# Patient Record
Sex: Female | Born: 1970 | Race: White | Hispanic: No | Marital: Married | Smoking: Never smoker
Health system: Southern US, Community
[De-identification: ages and names within clinical notes are randomized; demographics above are authoritative.]

## PROBLEM LIST (undated history)

## (undated) DIAGNOSIS — E119 Type 2 diabetes mellitus without complications: Secondary | ICD-10-CM

## (undated) DIAGNOSIS — R112 Nausea with vomiting, unspecified: Secondary | ICD-10-CM

## (undated) DIAGNOSIS — R011 Cardiac murmur, unspecified: Secondary | ICD-10-CM

## (undated) DIAGNOSIS — Z9889 Other specified postprocedural states: Secondary | ICD-10-CM

## (undated) DIAGNOSIS — Z87442 Personal history of urinary calculi: Secondary | ICD-10-CM

## (undated) DIAGNOSIS — Z8489 Family history of other specified conditions: Secondary | ICD-10-CM

## (undated) DIAGNOSIS — I10 Essential (primary) hypertension: Secondary | ICD-10-CM

## (undated) HISTORY — PX: HERNIA REPAIR: SHX51

## (undated) HISTORY — PX: CHOLECYSTECTOMY: SHX55

## (undated) HISTORY — PX: SHOULDER SURGERY: SHX246

## (undated) HISTORY — PX: TUBAL LIGATION: SHX77

## (undated) HISTORY — PX: TEMPOROMANDIBULAR JOINT SURGERY: SHX35

## (undated) HISTORY — PX: HIP SURGERY: SHX245

## (undated) HISTORY — PX: ABDOMINAL HYSTERECTOMY: SHX81

## (undated) HISTORY — PX: APPENDECTOMY: SHX54

---

## 2002-04-02 ENCOUNTER — Other Ambulatory Visit: Admission: RE | Admit: 2002-04-02 | Discharge: 2002-04-02 | Payer: Self-pay | Admitting: Obstetrics & Gynecology

## 2002-09-16 ENCOUNTER — Inpatient Hospital Stay (HOSPITAL_COMMUNITY): Admission: RE | Admit: 2002-09-16 | Discharge: 2002-09-17 | Payer: Self-pay | Admitting: Obstetrics & Gynecology

## 2003-05-12 ENCOUNTER — Emergency Department (HOSPITAL_COMMUNITY): Admission: EM | Admit: 2003-05-12 | Discharge: 2003-05-12 | Payer: Self-pay | Admitting: Emergency Medicine

## 2005-05-21 ENCOUNTER — Other Ambulatory Visit: Admission: RE | Admit: 2005-05-21 | Discharge: 2005-05-21 | Payer: Self-pay | Admitting: Obstetrics & Gynecology

## 2014-11-29 ENCOUNTER — Other Ambulatory Visit (HOSPITAL_COMMUNITY): Payer: Self-pay | Admitting: Obstetrics & Gynecology

## 2014-11-30 LAB — CYTOLOGY - PAP

## 2018-01-09 ENCOUNTER — Other Ambulatory Visit: Payer: Self-pay | Admitting: Surgery

## 2018-01-09 DIAGNOSIS — R599 Enlarged lymph nodes, unspecified: Secondary | ICD-10-CM

## 2018-01-28 ENCOUNTER — Ambulatory Visit
Admission: RE | Admit: 2018-01-28 | Discharge: 2018-01-28 | Disposition: A | Discharge status: Home / Self Care | Payer: BLUE CROSS/BLUE SHIELD | Source: Ambulatory Visit | Attending: Surgery | Admitting: Surgery

## 2018-01-28 DIAGNOSIS — R599 Enlarged lymph nodes, unspecified: Secondary | ICD-10-CM

## 2018-01-28 MED ORDER — IOPAMIDOL (ISOVUE-300) INJECTION 61%
100.0000 mL | Freq: Once | INTRAVENOUS | Status: AC | PRN
  Administered 2018-01-28: 100 mL via INTRAVENOUS

## 2018-02-03 ENCOUNTER — Other Ambulatory Visit: Payer: Self-pay | Admitting: Surgery

## 2018-02-06 ENCOUNTER — Other Ambulatory Visit: Payer: Self-pay | Admitting: Surgery

## 2018-02-06 DIAGNOSIS — R599 Enlarged lymph nodes, unspecified: Secondary | ICD-10-CM

## 2018-02-13 ENCOUNTER — Ambulatory Visit
Admission: RE | Admit: 2018-02-13 | Discharge: 2018-02-13 | Disposition: A | Discharge status: Home / Self Care | Payer: BLUE CROSS/BLUE SHIELD | Source: Ambulatory Visit | Attending: Surgery | Admitting: Surgery

## 2018-02-13 DIAGNOSIS — R599 Enlarged lymph nodes, unspecified: Secondary | ICD-10-CM

## 2018-02-26 ENCOUNTER — Ambulatory Visit: Payer: Self-pay | Admitting: Surgery

## 2018-02-26 NOTE — Patient Instructions (Addendum)
Jill Savage  02/26/2018   Your procedure is scheduled on: 03-03-18   Report to [facility] Main  Entrance             Report to admitting at        0730 AM    Call this number if you have problems the morning of surgery [phone]    Remember: Do not eat food or drink liquids :After Midnight.     Take these medicines the morning of surgery with A SIP OF WATER: omeprazole, lipitor, zyrtec                                 You may not have any metal on your body including hair pins and              piercings  Do not wear jewelry, make-up, lotions, powders or perfumes, deodorant             Do not wear nail polish.  Do not shave  48 hours prior to surgery.            Do not bring valuables to the hospital. [place].  Contacts, dentures or bridgework may not be worn into surgery.     Patients discharged the day of surgery will not be allowed to drive home.  Name and phone number of your driver:  Special Instructions: N/A              Please read over the following fact sheets you were given: _____________________________________________________________________           [facility] - Preparing for Surgery Before surgery, you can play an important role.  Because skin is not sterile, your skin needs to be as free of germs as possible.  You can reduce the number of germs on your skin by washing with CHG (chlorahexidine gluconate) soap before surgery.  CHG is an antiseptic cleaner which kills germs and bonds with the skin to continue killing germs even after washing. Please DO NOT use if you have an allergy to CHG or antibacterial soaps.  If your skin becomes reddened/irritated stop using the CHG and inform your nurse when you arrive at Short Stay. Do not shave (including legs and underarms) for at least 48 hours prior to the first CHG shower.  You may shave your face/neck. Please follow these instructions  carefully:  1.  Shower with CHG Soap the night before surgery and the  morning of Surgery.  2.  If you choose to wash your hair, wash your hair first as usual with your  normal  shampoo.  3.  After you shampoo, rinse your hair and body thoroughly to remove the  shampoo.                           4.  Use CHG as you would any other liquid soap.  You can apply chg directly  to the skin and wash                       Gently with a scrungie or clean washcloth.  5.  Apply the CHG Soap to your body ONLY  FROM THE NECK DOWN.   Do not use on face/ open                           Wound or open sores. Avoid contact with eyes, ears mouth and genitals (private parts).                       Wash face,  Genitals (private parts) with your normal soap.             6.  Wash thoroughly, paying special attention to the area where your surgery  will be performed.  7.  Thoroughly rinse your body with warm water from the neck down.  8.  DO NOT shower/wash with your normal soap after using and rinsing off  the CHG Soap.                9.  Pat yourself dry with a clean towel.            10.  Wear clean pajamas.            11.  Place clean sheets on your bed the night of your first shower and do not  sleep with pets. Day of Surgery : Do not apply any lotions/deodorants the morning of surgery.  Please wear clean clothes to the hospital/surgery center.  FAILURE TO FOLLOW THESE INSTRUCTIONS MAY RESULT IN THE CANCELLATION OF YOUR SURGERY PATIENT SIGNATURE_________________________________  NURSE SIGNATURE__________________________________  ________________________________________________________________________ How to Manage Your Diabetes Before and After Surgery  Why is it important to control my blood sugar before and after surgery? . Improving blood sugar levels before and after surgery helps healing and can limit problems. . A way of improving blood sugar control is eating a healthy diet by: o  Eating less sugar and  carbohydrates o  Increasing activity/exercise o  Talking with your doctor about reaching your blood sugar goals . High blood sugars (greater than 180 mg/dL) can raise your risk of infections and slow your recovery, so you will need to focus on controlling your diabetes during the weeks before surgery. . Make sure that the doctor who takes care of your diabetes knows about your planned surgery including the date and location.  How do I manage my blood sugar before surgery? . Check your blood sugar at least 4 times a day, starting 2 days before surgery, to make sure that the level is not too high or low. o Check your blood sugar the morning of your surgery when you wake up and every 2 hours until you get to the Short Stay unit. . If your blood sugar is less than 70 mg/dL, you will need to treat for low blood sugar: o Do not take insulin. o Treat a low blood sugar (less than 70 mg/dL) with  cup of clear juice (cranberry or apple), 4 glucose tablets, OR glucose gel. o Recheck blood sugar in 15 minutes after treatment (to make sure it is greater than 70 mg/dL). If your blood sugar is not greater than 70 mg/dL on recheck, call [phone] for further instructions. . Report your blood sugar to the short stay nurse when you get to Short Stay.  . If you are admitted to the hospital after surgery: o Your blood sugar will be checked by the staff and you will probably be given insulin after surgery (instead of oral diabetes medicines) to make sure you have good blood sugar levels.  o The goal for blood sugar control after surgery is 80-180 mg/dL.   WHAT DO I DO ABOUT MY DIABETES MEDICATION?  Marland Kitchen  . Do not take oral diabetes medicines (pills) the morning of surgery.  Marland Kitchen ncluding Byetta (exenatide), Bydureon (exenatide ER), Victoza (liraglutide), or Trulicity (dulaglutide).  . If your CBG is greater than 220 mg/dL, you may take  of your sliding scale  . (correction) dose of insulin.    For patients  with insulin pumps: Contact your diabetes doctor for specific instructions before surgery. Decrease basal rates by 20% at midnight the night before your surgery. Note that if your surgery is planned to be longer than 2 hours, your insulin pump will be removed and intravenous (IV) insulin will be started and managed by the nurses and the anesthesiologist. You will be able to restart your insulin pump once you are awake and able to manage it.  Make sure to bring insulin pump supplies to the hospital with you in case the  site needs to be changed.  Patient Signature:  Date:   Nurse Signature:  Date:   Reviewed and Endorsed by [facility] Patient Education Committee, August 2015

## 2018-02-27 ENCOUNTER — Other Ambulatory Visit: Payer: Self-pay

## 2018-02-27 ENCOUNTER — Encounter (HOSPITAL_COMMUNITY): Payer: Self-pay

## 2018-02-27 ENCOUNTER — Encounter (HOSPITAL_COMMUNITY)
Admission: RE | Admit: 2018-02-27 | Discharge: 2018-02-27 | Disposition: A | Discharge status: Home / Self Care | Payer: BLUE CROSS/BLUE SHIELD | Source: Ambulatory Visit | Attending: Surgery | Admitting: Surgery

## 2018-02-27 DIAGNOSIS — Z01810 Encounter for preprocedural cardiovascular examination: Secondary | ICD-10-CM | POA: Insufficient documentation

## 2018-02-27 DIAGNOSIS — Z01812 Encounter for preprocedural laboratory examination: Secondary | ICD-10-CM | POA: Insufficient documentation

## 2018-02-27 DIAGNOSIS — K4090 Unilateral inguinal hernia, without obstruction or gangrene, not specified as recurrent: Secondary | ICD-10-CM | POA: Insufficient documentation

## 2018-02-27 HISTORY — DX: Other specified postprocedural states: Z98.890

## 2018-02-27 HISTORY — DX: Personal history of urinary calculi: Z87.442

## 2018-02-27 HISTORY — DX: Family history of other specified conditions: Z84.89

## 2018-02-27 HISTORY — DX: Cardiac murmur, unspecified: R01.1

## 2018-02-27 HISTORY — DX: Nausea with vomiting, unspecified: R11.2

## 2018-02-27 HISTORY — DX: Essential (primary) hypertension: I10

## 2018-02-27 HISTORY — DX: Type 2 diabetes mellitus without complications: E11.9

## 2018-02-27 LAB — BASIC METABOLIC PANEL
Anion gap: 8 (ref 5–15)
BUN: 14 mg/dL (ref 6–20)
CO2: 31 mmol/L (ref 22–32)
CREATININE: 0.68 mg/dL (ref 0.44–1.00)
Calcium: 9.4 mg/dL (ref 8.9–10.3)
Chloride: 101 mmol/L (ref 98–111)
Glucose, Bld: 258 mg/dL — ABNORMAL HIGH (ref 70–99)
Potassium: 4.2 mmol/L (ref 3.5–5.1)
SODIUM: 140 mmol/L (ref 135–145)

## 2018-02-27 LAB — CBC
HCT: 41.1 % (ref 36.0–46.0)
HEMOGLOBIN: 14.1 g/dL (ref 12.0–15.0)
MCH: 30.1 pg (ref 26.0–34.0)
MCHC: 34.3 g/dL (ref 30.0–36.0)
MCV: 87.6 fL (ref 78.0–100.0)
Platelets: 294 10*3/uL (ref 150–400)
RBC: 4.69 MIL/uL (ref 3.87–5.11)
RDW: 12.6 % (ref 11.5–15.5)
WBC: 5.3 10*3/uL (ref 4.0–10.5)

## 2018-02-27 LAB — GLUCOSE, CAPILLARY: Glucose-Capillary: 281 mg/dL — ABNORMAL HIGH (ref 70–99)

## 2018-02-27 LAB — HEMOGLOBIN A1C
HEMOGLOBIN A1C: 7.3 % — AB (ref 4.8–5.6)
MEAN PLASMA GLUCOSE: 162.81 mg/dL

## 2018-02-28 ENCOUNTER — Other Ambulatory Visit (HOSPITAL_COMMUNITY): Payer: BLUE CROSS/BLUE SHIELD

## 2018-03-03 ENCOUNTER — Ambulatory Visit (HOSPITAL_COMMUNITY): Payer: BLUE CROSS/BLUE SHIELD | Admitting: Anesthesiology

## 2018-03-03 ENCOUNTER — Ambulatory Visit (HOSPITAL_COMMUNITY)
Admission: RE | Admit: 2018-03-03 | Discharge: 2018-03-03 | Disposition: A | Discharge status: Home / Self Care | Payer: BLUE CROSS/BLUE SHIELD | Source: Ambulatory Visit | Attending: Surgery | Admitting: Surgery

## 2018-03-03 ENCOUNTER — Encounter (HOSPITAL_COMMUNITY): Payer: Self-pay | Admitting: Surgery

## 2018-03-03 ENCOUNTER — Encounter (HOSPITAL_COMMUNITY)
Admission: RE | Disposition: A | Discharge status: Home / Self Care | Payer: Self-pay | Source: Ambulatory Visit | Attending: Surgery

## 2018-03-03 DIAGNOSIS — D219 Benign neoplasm of connective and other soft tissue, unspecified: Secondary | ICD-10-CM | POA: Insufficient documentation

## 2018-03-03 DIAGNOSIS — Z820 Family history of epilepsy and other diseases of the nervous system: Secondary | ICD-10-CM | POA: Insufficient documentation

## 2018-03-03 DIAGNOSIS — E108 Type 1 diabetes mellitus with unspecified complications: Secondary | ICD-10-CM | POA: Insufficient documentation

## 2018-03-03 DIAGNOSIS — K4030 Unilateral inguinal hernia, with obstruction, without gangrene, not specified as recurrent: Secondary | ICD-10-CM | POA: Diagnosis not present

## 2018-03-03 DIAGNOSIS — Z882 Allergy status to sulfonamides status: Secondary | ICD-10-CM | POA: Diagnosis not present

## 2018-03-03 DIAGNOSIS — Z79899 Other long term (current) drug therapy: Secondary | ICD-10-CM | POA: Diagnosis not present

## 2018-03-03 DIAGNOSIS — Z888 Allergy status to other drugs, medicaments and biological substances status: Secondary | ICD-10-CM | POA: Insufficient documentation

## 2018-03-03 DIAGNOSIS — Z87442 Personal history of urinary calculi: Secondary | ICD-10-CM | POA: Diagnosis not present

## 2018-03-03 DIAGNOSIS — Z8249 Family history of ischemic heart disease and other diseases of the circulatory system: Secondary | ICD-10-CM | POA: Insufficient documentation

## 2018-03-03 DIAGNOSIS — Z8261 Family history of arthritis: Secondary | ICD-10-CM | POA: Insufficient documentation

## 2018-03-03 DIAGNOSIS — K4090 Unilateral inguinal hernia, without obstruction or gangrene, not specified as recurrent: Secondary | ICD-10-CM

## 2018-03-03 DIAGNOSIS — Z90710 Acquired absence of both cervix and uterus: Secondary | ICD-10-CM | POA: Insufficient documentation

## 2018-03-03 DIAGNOSIS — I10 Essential (primary) hypertension: Secondary | ICD-10-CM | POA: Diagnosis not present

## 2018-03-03 DIAGNOSIS — K219 Gastro-esophageal reflux disease without esophagitis: Secondary | ICD-10-CM | POA: Insufficient documentation

## 2018-03-03 DIAGNOSIS — Z841 Family history of disorders of kidney and ureter: Secondary | ICD-10-CM | POA: Diagnosis not present

## 2018-03-03 DIAGNOSIS — Z9049 Acquired absence of other specified parts of digestive tract: Secondary | ICD-10-CM | POA: Insufficient documentation

## 2018-03-03 DIAGNOSIS — Z794 Long term (current) use of insulin: Secondary | ICD-10-CM | POA: Diagnosis not present

## 2018-03-03 DIAGNOSIS — R1909 Other intra-abdominal and pelvic swelling, mass and lump: Secondary | ICD-10-CM | POA: Diagnosis present

## 2018-03-03 DIAGNOSIS — Z833 Family history of diabetes mellitus: Secondary | ICD-10-CM | POA: Insufficient documentation

## 2018-03-03 HISTORY — PX: INSERTION OF MESH: SHX5868

## 2018-03-03 HISTORY — PX: INGUINAL HERNIA REPAIR: SHX194

## 2018-03-03 LAB — GLUCOSE, CAPILLARY
Glucose-Capillary: 185 mg/dL — ABNORMAL HIGH (ref 70–99)
Glucose-Capillary: 169 mg/dL — ABNORMAL HIGH (ref 70–99)

## 2018-03-03 SURGERY — REPAIR, HERNIA, INGUINAL, ADULT
Anesthesia: General | Laterality: Right

## 2018-03-03 MED ORDER — FENTANYL CITRATE (PF) 100 MCG/2ML IJ SOLN
INTRAMUSCULAR | Status: AC
  Filled 2018-03-03: qty 2

## 2018-03-03 MED ORDER — PROPOFOL 500 MG/50ML IV EMUL
INTRAVENOUS | Status: DC | PRN
  Administered 2018-03-03: 50 ug/kg/min via INTRAVENOUS

## 2018-03-03 MED ORDER — MIDAZOLAM HCL 2 MG/2ML IJ SOLN
INTRAMUSCULAR | Status: AC
  Filled 2018-03-03: qty 2

## 2018-03-03 MED ORDER — LIDOCAINE 2% (20 MG/ML) 5 ML SYRINGE
INTRAMUSCULAR | Status: DC | PRN
  Administered 2018-03-03: 50 mg via INTRAVENOUS

## 2018-03-03 MED ORDER — MIDAZOLAM HCL 5 MG/5ML IJ SOLN
INTRAMUSCULAR | Status: DC | PRN
  Administered 2018-03-03: 2 mg via INTRAVENOUS

## 2018-03-03 MED ORDER — ONDANSETRON HCL 4 MG/2ML IJ SOLN: 4.0000 mg | Freq: Once | INTRAMUSCULAR | Status: DC | PRN

## 2018-03-03 MED ORDER — CHLORHEXIDINE GLUCONATE CLOTH 2 % EX PADS: 6.0000 | MEDICATED_PAD | Freq: Once | CUTANEOUS | Status: DC

## 2018-03-03 MED ORDER — PROPOFOL 10 MG/ML IV BOLUS
INTRAVENOUS | Status: AC
  Filled 2018-03-03: qty 20

## 2018-03-03 MED ORDER — FENTANYL CITRATE (PF) 100 MCG/2ML IJ SOLN
INTRAMUSCULAR | Status: DC | PRN
  Administered 2018-03-03 (×2): 50 ug via INTRAVENOUS

## 2018-03-03 MED ORDER — OXYCODONE HCL 5 MG PO TABS: 5.0000 mg | ORAL_TABLET | Freq: Once | ORAL | Status: DC | PRN

## 2018-03-03 MED ORDER — OXYCODONE HCL 5 MG/5ML PO SOLN
5.0000 mg | Freq: Once | ORAL | Status: DC | PRN
  Filled 2018-03-03: qty 5

## 2018-03-03 MED ORDER — REMIFENTANIL HCL 1 MG IV SOLR
INTRAVENOUS | Status: DC | PRN
  Administered 2018-03-03: .25 ug/kg/min via INTRAVENOUS

## 2018-03-03 MED ORDER — MIDAZOLAM HCL 2 MG/2ML IJ SOLN: 1.0000 mg | Freq: Once | INTRAMUSCULAR | Status: DC

## 2018-03-03 MED ORDER — PROPOFOL 10 MG/ML IV BOLUS
INTRAVENOUS | Status: AC
Start: 2018-03-03 — End: ?
  Filled 2018-03-03: qty 40

## 2018-03-03 MED ORDER — BUPIVACAINE HCL (PF) 0.5 % IJ SOLN
INTRAMUSCULAR | Status: DC | PRN
  Administered 2018-03-03: 20 mL

## 2018-03-03 MED ORDER — FENTANYL CITRATE (PF) 100 MCG/2ML IJ SOLN
50.0000 ug | Freq: Once | INTRAMUSCULAR | Status: DC
Start: 2018-03-03 — End: 2018-03-03

## 2018-03-03 MED ORDER — ROCURONIUM BROMIDE 10 MG/ML (PF) SYRINGE
PREFILLED_SYRINGE | INTRAVENOUS | Status: DC | PRN
  Administered 2018-03-03: 40 mg via INTRAVENOUS

## 2018-03-03 MED ORDER — SUGAMMADEX SODIUM 200 MG/2ML IV SOLN
INTRAVENOUS | Status: DC | PRN
  Administered 2018-03-03: 140 mg via INTRAVENOUS

## 2018-03-03 MED ORDER — PROPOFOL 10 MG/ML IV BOLUS
INTRAVENOUS | Status: DC | PRN
  Administered 2018-03-03: 150 mg via INTRAVENOUS

## 2018-03-03 MED ORDER — 0.9 % SODIUM CHLORIDE (POUR BTL) OPTIME
TOPICAL | Status: DC | PRN
  Administered 2018-03-03: 1000 mL

## 2018-03-03 MED ORDER — FENTANYL CITRATE (PF) 100 MCG/2ML IJ SOLN
25.0000 ug | INTRAMUSCULAR | Status: DC | PRN
  Administered 2018-03-03: 50 ug via INTRAVENOUS

## 2018-03-03 MED ORDER — BUPIVACAINE HCL (PF) 0.5 % IJ SOLN
INTRAMUSCULAR | Status: AC
  Filled 2018-03-03: qty 30

## 2018-03-03 MED ORDER — REMIFENTANIL HCL 1 MG IV SOLR
INTRAVENOUS | Status: AC
  Filled 2018-03-03: qty 1000

## 2018-03-03 MED ORDER — FENTANYL CITRATE (PF) 250 MCG/5ML IJ SOLN
INTRAMUSCULAR | Status: AC
  Filled 2018-03-03: qty 5

## 2018-03-03 MED ORDER — SODIUM CHLORIDE 0.9 % IJ SOLN
INTRAMUSCULAR | Status: AC
  Filled 2018-03-03: qty 20

## 2018-03-03 MED ORDER — ONDANSETRON HCL 4 MG/2ML IJ SOLN
INTRAMUSCULAR | Status: DC | PRN
  Administered 2018-03-03: 4 mg via INTRAVENOUS

## 2018-03-03 MED ORDER — DEXAMETHASONE SODIUM PHOSPHATE 10 MG/ML IJ SOLN
INTRAMUSCULAR | Status: DC | PRN
  Administered 2018-03-03: 8 mg via INTRAVENOUS

## 2018-03-03 MED ORDER — TRAMADOL HCL 50 MG PO TABS: 50.0000 mg | ORAL_TABLET | Freq: Four times a day (QID) | ORAL | 0 refills | Status: DC | PRN

## 2018-03-03 MED ORDER — CEFAZOLIN SODIUM-DEXTROSE 2-4 GM/100ML-% IV SOLN
2.0000 g | INTRAVENOUS | Status: AC
  Administered 2018-03-03: 2 g via INTRAVENOUS
  Filled 2018-03-03: qty 100

## 2018-03-03 MED ORDER — DEXAMETHASONE SODIUM PHOSPHATE 10 MG/ML IJ SOLN
INTRAMUSCULAR | Status: AC
  Filled 2018-03-03: qty 1

## 2018-03-03 MED ORDER — LACTATED RINGERS IV SOLN
INTRAVENOUS | Status: DC
Start: 2018-03-03 — End: 2018-03-03
  Administered 2018-03-03 (×2): via INTRAVENOUS

## 2018-03-03 MED ORDER — ONDANSETRON HCL 4 MG/2ML IJ SOLN
INTRAMUSCULAR | Status: AC
  Filled 2018-03-03: qty 2

## 2018-03-03 SURGICAL SUPPLY — 35 items
APL SWBSTK 6 STRL LF DISP (MISCELLANEOUS) ×1
APPLICATOR COTTON TIP 6 STRL (MISCELLANEOUS) ×1 IMPLANT
APPLICATOR COTTON TIP 6IN STRL (MISCELLANEOUS) ×2
BLADE SURG 15 STRL LF DISP TIS (BLADE) ×1 IMPLANT
BLADE SURG 15 STRL SS (BLADE) ×2
BLADE SURG SZ10 CARB STEEL (BLADE) IMPLANT
CHLORAPREP W/TINT 26ML (MISCELLANEOUS) ×3 IMPLANT
COVER SURGICAL LIGHT HANDLE (MISCELLANEOUS) ×2 IMPLANT
DECANTER SPIKE VIAL GLASS SM (MISCELLANEOUS) ×2 IMPLANT
DRAIN PENROSE 18X1/2 LTX STRL (DRAIN) ×1 IMPLANT
DRAPE LAPAROTOMY TRNSV 102X78 (DRAPE) ×2 IMPLANT
ELECT PENCIL ROCKER SW 15FT (MISCELLANEOUS) ×2 IMPLANT
ELECT REM PT RETURN 15FT ADLT (MISCELLANEOUS) ×2 IMPLANT
GAUZE SPONGE 4X4 12PLY STRL (GAUZE/BANDAGES/DRESSINGS) ×2 IMPLANT
GLOVE BIOGEL PI IND STRL 7.0 (GLOVE) ×1 IMPLANT
GLOVE BIOGEL PI INDICATOR 7.0 (GLOVE) ×1
GLOVE SURG ORTHO 8.0 STRL STRW (GLOVE) ×2 IMPLANT
GOWN STRL REUS W/TWL LRG LVL3 (GOWN DISPOSABLE) ×2 IMPLANT
GOWN STRL REUS W/TWL XL LVL3 (GOWN DISPOSABLE) ×4 IMPLANT
KIT BASIN OR (CUSTOM PROCEDURE TRAY) ×2 IMPLANT
MESH ULTRAPRO 3X6 7.6X15CM (Mesh General) ×1 IMPLANT
NDL HYPO 25X1 1.5 SAFETY (NEEDLE) ×1 IMPLANT
NEEDLE HYPO 25X1 1.5 SAFETY (NEEDLE) ×2 IMPLANT
PACK BASIC VI WITH GOWN DISP (CUSTOM PROCEDURE TRAY) ×2 IMPLANT
SPONGE LAP 4X18 RFD (DISPOSABLE) ×4 IMPLANT
STRIP CLOSURE SKIN 1/2X4 (GAUZE/BANDAGES/DRESSINGS) ×2 IMPLANT
SUT MNCRL AB 4-0 PS2 18 (SUTURE) ×2 IMPLANT
SUT NOVA NAB GS-21 0 18 T12 DT (SUTURE) ×1 IMPLANT
SUT NOVA NAB GS-22 2 0 T19 (SUTURE) ×4 IMPLANT
SUT SILK 2 0 SH (SUTURE) ×2 IMPLANT
SUT VIC AB 3-0 SH 18 (SUTURE) ×3 IMPLANT
SYR BULB IRRIGATION 50ML (SYRINGE) ×2 IMPLANT
SYR CONTROL 10ML LL (SYRINGE) ×2 IMPLANT
TOWEL OR 17X26 10 PK STRL BLUE (TOWEL DISPOSABLE) ×2 IMPLANT
YANKAUER SUCT BULB TIP 10FT TU (MISCELLANEOUS) ×2 IMPLANT

## 2018-03-03 NOTE — H&P (Signed)
General Surgery [facility]   Jill Savage DOB: [birth year] Married / Language: English / Race: White Female   History of Present Illness   The patient is a 47 year old female who presents with a complaint of enlarge lymph nodes.  CHIEF COMPLAINT: Enlarged right inguinal lymph node  Patient is referred by Dr. Dory Horn for surgical evaluation and management of an apparent enlarged right inguinal lymph node. Patient has a history of orthopedic problems with her hips. She has undergone bilateral hip surgery. Her last surgery on the left hip was 4 years ago. Her last procedure on the right hip was 1.5 years ago. Follow-up included an MRI scan done Nov 26, 2017 at [facility]. This had an incidental finding of a 3.7 cm right inguinal mass felt to represent either an enlarged lymph node, possible lymphocele, or other mass. This was communicated to her gynecologist. She underwent an ultrasound in the office. She also had laboratory studies which are reportedly normal. Patient is now referred to general surgery for further evaluation and recommendations. Patient denies any symptoms. She is certainly had no symptoms like weight loss or night sweats. She has had no history of recent trauma to the lower extremities. She denies any bug bites. She does have a dog but does not have a cat. Patient has had no evidence of other lymphadenopathy on self-examination. She presents today for assessment and recommendations.   Past Surgical History Appendectomy  Cesarean Section - 1  Gallbladder Surgery - Laparoscopic  Hip Surgery  Bilateral. Hysterectomy (not due to cancer) - Partial  Oral Surgery  Shoulder Surgery  Bilateral.  Diagnostic Studies History Colonoscopy  never Mammogram  within last year Pap Smear  1-5 years ago  Allergies Sulfa Antibiotics  Allergies Reconciled   Medication History NovoLOG (100UNIT/ML Solution, Subcutaneous)  Active. Hyzaar (100-12.5MG  Tablet, Oral) Active. Lipitor (10MG  Tablet, Oral) Active. Omeprazole (20MG  Capsule DR, Oral) Active. Potassium (99MG  Tablet, Oral) Active. Vitamin D (1000UNIT Tablet, Oral) Active. ZyrTEC Allergy (10MG  Tablet, Oral) Active. Medications Reconciled  Social History Alcohol use  Occasional alcohol use. Caffeine use  Coffee. No drug use  Tobacco use  Never smoker.  Family History Arthritis  Mother. Diabetes Mellitus  Father. Heart Disease  Father. Hypertension  Brother, Father, Mother. Ischemic Bowel Disease  Brother, Mother. Kidney Disease  Father. Migraine Headache  Mother.  Pregnancy / Birth History Age at menarche  57 years. Contraceptive History  Oral contraceptives. Gravida  1 Length (months) of breastfeeding  12-24 Maternal age  57-30 Para  1  Other Problems  Diabetes Mellitus  Gastroesophageal Reflux Disease  General anesthesia - complications  High blood pressure  Hypercholesterolemia  Kidney Stone   Review of Systems General Not Present- Appetite Loss, Chills, Fatigue, Fever, Night Sweats, Weight Gain and Weight Loss. Skin Not Present- Change in Wart/Mole, Dryness, Hives, Jaundice, New Lesions, Non-Healing Wounds, Rash and Ulcer. HEENT Present- Seasonal Allergies and Wears glasses/contact lenses. Not Present- Earache, Hearing Loss, Hoarseness, Nose Bleed, Oral Ulcers, Ringing in the Ears, Sinus Pain, Sore Throat, Visual Disturbances and Yellow Eyes. Respiratory Not Present- Bloody sputum, Chronic Cough, Difficulty Breathing, Snoring and Wheezing. Breast Not Present- Breast Mass, Breast Pain, Nipple Discharge and Skin Changes. Cardiovascular Not Present- Chest Pain, Difficulty Breathing Lying Down, Leg Cramps, Palpitations, Rapid Heart Rate, Shortness of Breath and Swelling of Extremities. Gastrointestinal Not Present- Abdominal Pain, Bloating, Bloody Stool, Change in Bowel Habits, Chronic diarrhea,  Constipation, Difficulty Swallowing, Excessive gas, Gets full quickly at  meals, Hemorrhoids, Indigestion, Nausea, Rectal Pain and Vomiting. Female Genitourinary Present- Pelvic Pain. Not Present- Frequency, Nocturia, Painful Urination and Urgency. Musculoskeletal Present- Joint Stiffness. Not Present- Back Pain, Joint Pain, Muscle Pain, Muscle Weakness and Swelling of Extremities. Neurological Not Present- Decreased Memory, Fainting, Headaches, Numbness, Seizures, Tingling, Tremor, Trouble walking and Weakness. Psychiatric Not Present- Anxiety, Bipolar, Change in Sleep Pattern, Depression, Fearful and Frequent crying. Endocrine Not Present- Cold Intolerance, Excessive Hunger, Hair Changes, Heat Intolerance, Hot flashes and New Diabetes. Hematology Not Present- Blood Thinners, Easy Bruising, Excessive bleeding, Gland problems, HIV and Persistent Infections.  Vitals Weight: 153.6 lb Height: 63in Body Surface Area: 1.73 m Body Mass Index: 27.21 kg/m  Temp.: 97.44F  Pulse: 100 (Regular)  BP: 132/88 (Sitting, Left Arm, Standard)  Physical Exam The physical exam findings are as follows: Note:See vital signs recorded above  GENERAL APPEARANCE Development: normal Nutritional status: normal Gross deformities: none  SKIN Rash, lesions, ulcers: none Induration, erythema: none Nodules: none palpable  EYES Conjunctiva and lids: normal Pupils: equal and reactive Iris: normal bilaterally  EARS, NOSE, MOUTH, THROAT External ears: no lesion or deformity External nose: no lesion or deformity Hearing: grossly normal Lips: no lesion or deformity Dentition: normal for age Oral mucosa: moist  NECK Symmetric: yes Trachea: midline Thyroid: no palpable nodules in the thyroid bed  CHEST Respiratory effort: normal Retraction or accessory muscle use: no Breath sounds: normal bilaterally Rales, rhonchi, wheeze: none  CARDIOVASCULAR Auscultation: regular rhythm, normal  rate Murmurs: none Pulses: carotid and radial pulse 2+ palpable Lower extremity edema: none Lower extremity varicosities: none  ABDOMEN Distension: none Masses: none palpable Tenderness: none Hepatosplenomegaly: not present Hernia: not present  GENITOURINARY Palpation in both the left and right inguinal region reveals no palpable mass and no tenderness. Well-healed Pfannenstiel incision  MUSCULOSKELETAL Station and gait: normal Digits and nails: no clubbing or cyanosis Muscle strength: grossly normal all extremities Range of motion: grossly normal all extremities Deformity: none  LYMPHATIC Cervical: none palpable Supraclavicular: none palpable No palpable inguinal lymphadenopathy  PSYCHIATRIC Oriented to person, place, and time: yes Mood and affect: normal for situation Judgment and insight: appropriate for situation    Assessment & Plan  ENLARGED LYMPH NODE (R59.9)  Follow Up - Call [facility] office after tests / studies doneto discuss further plans  Patient presents on referral from her gynecologist for evaluation of an enlarged right inguinal lymph node seen on MRI scan on Nov 26, 2017. Lymph node measured approximately 3.7 cm in greatest dimension. Alternatively, this may represent a lymphocele or other soft tissue mass. Patient is asymptomatic. She has no worrisome symptoms or findings.  We discussed further evaluation. Radiology has recommended a CT scan with contrast. We will make arrangements for the study in the near future. I will contact her with those results when they are available. If necessary, we will consider a percutaneous needle biopsy for tissue diagnosis pending the CT findings. Likely this is a benign process.  Patient will undergo CT scan of we will contact her with those results when they are available.   ADDENDUM:  History of Present Illness  The patient is a 47 year old female who presents for a pre-op visit.  CHIEF COMPLAINT: right  groin mass, likely ovary in [facility]  Patient returns to discuss results of CT scan, abdominal ultrasound, and transvaginal ultrasound. It appears that there is most likely an ovary in the right inguinal canal. This represents a right inguinal hernia with incarcerated ovary. This does not appear to  be a lymph node. Ovary appears viable. Patient has been having more symptoms with lifting and physical activity. She would like to proceed with surgery in the near future.   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 Hysterectomy (not due to cancer) - Partial  Oral Surgery  Shoulder Surgery  Bilateral.  Diagnostic Studies History Colonoscopy  never Mammogram  within last year Pap Smear  1-5 years ago  Allergies Sulfa Antibiotics   Medication History NovoLOG (100UNIT/ML Solution, Subcutaneous) Active. Hyzaar (100-12.5MG  Tablet, Oral) Active. Lipitor (10MG  Tablet, Oral) Active. Omeprazole (20MG  Capsule DR, Oral) Active. Potassium (99MG  Tablet, Oral) Active. Vitamin D (1000UNIT Tablet, Oral) Active. ZyrTEC Allergy (10MG  Tablet, Oral) Active. Medications Reconciled  Social History Alcohol use  Occasional alcohol use. Caffeine use  Coffee. No drug use  Tobacco use  Never smoker.  Family History Arthritis  Mother. Diabetes Mellitus  Father. Heart Disease  Father. Hypertension  Brother, Father, Mother. Ischemic Bowel Disease  Brother, Mother. Kidney Disease  Father. Migraine Headache  Mother.  Pregnancy / Birth History  Age at menarche  48 years. Contraceptive History  Oral contraceptives. 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 This likely represents an ovary incarcerated in a right inguinal hernia. Further diagnostic testing is not indicated.  I have recommended open right inguinal hernia repair with reduction of the ovary back within the peritoneal cavity. We will evaluate the ovary at the time of surgery and if there is any significant abnormality, we will obtain GYN consultation. We plan repair of right inguinal hernia with mesh by open technique. This should be an outpatient surgical procedure. We discussed restrictions on her activities after the surgery. We discussed the possibility of recurrence. If there is significant abnormality with the ovary, we discussed possible laparoscopy during the procedure. Patient understands and wishes to proceed in the immediate future.  The risks and benefits of the procedure have been discussed at length with the patient. The patient understands the proposed procedure, potential alternative treatments, and the course of recovery to be expected. All of the patient's questions have been answered at this time. The patient wishes to proceed with surgery.   Armandina Gemma, [facility] Office: [phone]

## 2018-03-03 NOTE — Anesthesia Preprocedure Evaluation (Addendum)
Anesthesia Evaluation  Patient identified by MRN, date of birth, ID band Patient awake    Reviewed: Allergy & Precautions, NPO status , Patient's Chart, lab work & pertinent test results  History of Anesthesia Complications (+) PONV and history of anesthetic complications  Airway Mallampati: II  TM Distance: >3 FB Neck ROM: Full    Dental  (+) Dental Advisory Given, Teeth Intact   Pulmonary neg pulmonary ROS,    breath sounds clear to auscultation       Cardiovascular hypertension, Pt. on medications  Rhythm:Regular Rate:Normal     Neuro/Psych negative neurological ROS  negative psych ROS   GI/Hepatic Neg liver ROS, GERD  Controlled and Medicated, Incarcerated right inguinal hernia    Endo/Other  diabetes, Type 1, Insulin Dependent  Renal/GU negative Renal ROS  negative genitourinary   Musculoskeletal negative musculoskeletal ROS (+)   Abdominal   Peds  Hematology negative hematology ROS (+)   Anesthesia Other Findings   Reproductive/Obstetrics                           Anesthesia Physical Anesthesia Plan  ASA: II  Anesthesia Plan: General   Post-op Pain Management:    Induction: Intravenous  PONV Risk Score and Plan: 4 or greater and Treatment may vary due to age or medical condition, Ondansetron, Dexamethasone, Midazolam and TIVA  Airway Management Planned: Oral ETT  Additional Equipment: None  Intra-op Plan:   Post-operative Plan: Extubation in OR  Informed Consent: I have reviewed the patients History and Physical, chart, labs and discussed the procedure including the risks, benefits and alternatives for the proposed anesthesia with the patient or authorized representative who has indicated his/her understanding and acceptance.   Dental advisory given  Plan Discussed with: CRNA and Anesthesiologist  Anesthesia Plan Comments:        Anesthesia Quick  Evaluation

## 2018-03-03 NOTE — Anesthesia Procedure Notes (Signed)
Procedure Name: Intubation Date/Time: 03/03/2018 10:20 AM Performed by: Anne Fu, CRNA Pre-anesthesia Checklist: Patient identified, Emergency Drugs available, Suction available, Patient being monitored and Timeout performed Patient Re-evaluated:Patient Re-evaluated prior to induction Oxygen Delivery Method: Circle system utilized Preoxygenation: Pre-oxygenation with 100% oxygen Induction Type: IV induction Ventilation: Mask ventilation without difficulty Laryngoscope Size: Mac and 4 Grade View: Grade I Tube type: Oral Tube size: 7.5 mm Number of attempts: 1 Airway Equipment and Method: Stylet Placement Confirmation: ETT inserted through vocal cords under direct vision,  positive ETCO2 and breath sounds checked- equal and bilateral Secured at: 21 cm Tube secured with: Tape Dental Injury: Teeth and Oropharynx as per pre-operative assessment

## 2018-03-03 NOTE — Anesthesia Postprocedure Evaluation (Signed)
Anesthesia Post Note  Patient: Jill Savage  Procedure(s) Performed: OPEN RIGHT INGUINAL HERNIA REPAIR WITH MESH (Right ) OOPHORECTOMY (Right )     Patient location during evaluation: PACU Anesthesia Type: General Level of consciousness: awake and alert Pain management: pain level controlled Vital Signs Assessment: post-procedure vital signs reviewed and stable Respiratory status: spontaneous breathing, nonlabored ventilation and respiratory function stable Cardiovascular status: blood pressure returned to baseline and stable Postop Assessment: no apparent nausea or vomiting Anesthetic complications: no    Last Vitals:  Vitals:   03/03/18 1232 03/03/18 1249  BP: (!) 143/85 124/68  Pulse: 82 66  Resp: 16 18  Temp: 37 C 37 C  SpO2: 96% 96%    Last Pain:  Vitals:   03/03/18 1243  TempSrc:   PainSc: 3                  Audry Pili

## 2018-03-03 NOTE — Op Note (Signed)
Procedure Note  Pre-operative Diagnosis:  RIH, with incarcerated right ovary  Post-operative Diagnosis:  same  Surgeon:  Armandina Gemma, MD  Assistant:  none   Procedure:  1. Open repair RIH with mesh  2. Right oophorectomy  Anesthesia:  gen  Estimated Blood Loss:  minimal  Drains: none         Specimen: right ovary to pathology  Indications: Patient is a 47 year old female referred by her gynecologist with right groin mass.  Evaluation included CT scan, transvaginal ultrasound, and abdominal ultrasound.  Patient appears to have a right ovary incarcerated and a right inguinal hernia.  She now comes to surgery for repair.  Procedure Details:  The patient was seen in the pre-op holding area. The risks, benefits, complications, treatment options, and expected outcomes were previously discussed with the patient. The patient agreed with the proposed plan and has signed the informed consent form.  The patient was brought to the Operating Room, identified as Jill Savage and the procedure verified. A "time out" was completed and the above information confirmed.  Patient was placed in a supine position on the operating room table.  Patient was positioned and then prepped and draped in the usual aseptic fashion.  After ascertaining an adequate level of anesthesia been achieved, a right inguinal incision is made for a #15 blade.  Dissection is carried through subcutaneous tissues down to the external oblique fascia.  Hemostasis is achieved with the electrocautery.  External oblique fascia is incised in line with its fibers.  Incision is extended through the external inguinal ring.  Inguinal canal is developed.  There is a palpable mass just inside the internal inguinal ring.  The floor the inguinal canal is opened exposing the mass.  The mass appears to be a normal ovary.  The ovary is completely dissected out and an attempt is made to reduce the ovary back into the peritoneal cavity.  However, this is  not possible.  Therefore the vascular pedicle was clamped and suture-ligated with a 0 Novafil suture ligature.  Vessels are divided with the electrocautery and the ovary is excised and submitted to pathology for review.  Floor the inguinal canal was closed with interrupted 0 Novafil simple sutures.  Floor the canal was then reinforced with a sheet of Ethicon ultra pro mesh.  This is secured to the pubic tubercle and along the inguinal ligament with a running 2-0 Novafil suture.  Superior and lateral margins of the mesh were secured to the internal oblique and transversalis fascia with interrupted 2-0 Novafil sutures.  Local field block is placed with Marcaine.  External oblique is closed with interrupted 3-0 Vicryl sutures.  Subcutaneous tissues are closed with interrupted 3-0 Vicryl sutures.  Skin is anesthetized with local anesthetic.  Skin is closed with a running 4-0 Monocryl subcuticular suture.  Wound is washed and dried and Steri-Strips are applied.  Sterile dressings were applied.  Patient is awakened from anesthesia and brought to the recovery room.  The patient tolerated the procedure well.  Armandina Gemma, [facility] Office: [phone]

## 2018-03-03 NOTE — Transfer of Care (Signed)
Immediate Anesthesia Transfer of Care Note  Patient: Jill Savage  Procedure(s) Performed: Procedure(s): OPEN RIGHT INGUINAL HERNIA REPAIR WITH MESH (Right) OOPHORECTOMY (Right)  Patient Location: PACU  Anesthesia Type:TIVA  Level of Consciousness:  sedated, patient cooperative and responds to stimulation  Airway & Oxygen Therapy:Patient Spontanous Breathing and Patient connected to face mask oxgen  Post-op Assessment:  Report given to PACU RN and Post -op Vital signs reviewed and stable  Post vital signs:  Reviewed and stable  Last Vitals:  Vitals:   03/03/18 0800  BP: (!) 149/76  Pulse: 71  Resp: 16  Temp: 36.7 C  SpO2: 179%    Complications: No apparent anesthesia complications

## 2018-03-04 ENCOUNTER — Encounter (HOSPITAL_COMMUNITY): Payer: Self-pay | Admitting: Surgery

## 2019-02-03 IMAGING — CT CT ABD-PELV W/ CM
2 of 5 series · 15 of 46 positions shown, 17 images · IV contrast (iopamidol)
Comparison: None

CLINICAL DATA: Enlarged lymph nodes.

EXAM:
CT ABDOMEN AND PELVIS WITH CONTRAST
TECHNIQUE: Multidetector CT imaging of the abdomen and pelvis was performed
using the standard protocol following bolus administration of
intravenous contrast.
Creatinine was obtained on site at [HOSPITAL] at [REDACTED].
Results: Creatinine 0.6 mg/dL.
CONTRAST:  100mL BG3JVD-QNN IOPAMIDOL (BG3JVD-QNN) INJECTION 61%

[Series 2: abd pelvis 5.00 br40 s3 ax · axial · 0.68mm/px · z∈[+1050,+1470]mm · 12 of 94 slices shown, 14 images]
[im 5/94  soft-tissue]
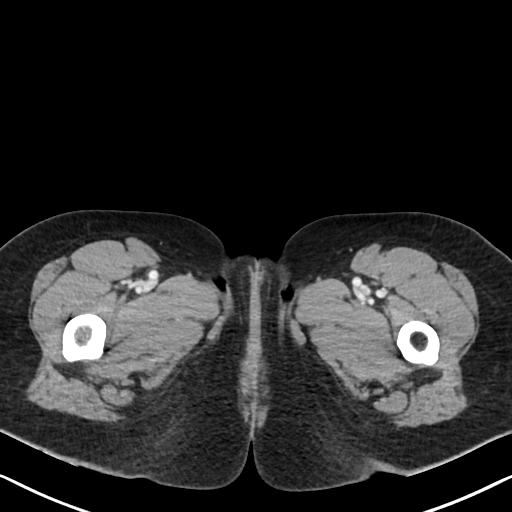
[im 5/94  bone]
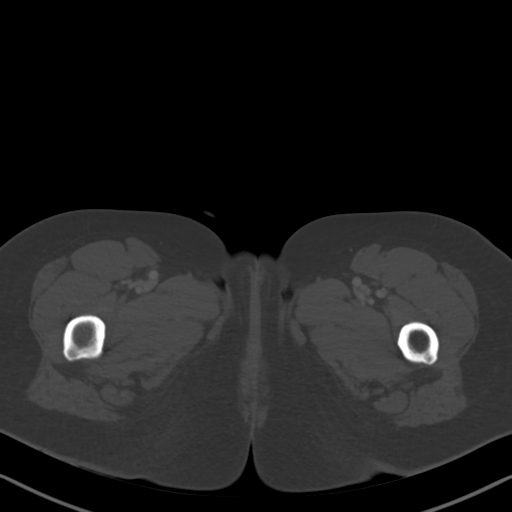
[im 14/94  soft-tissue]
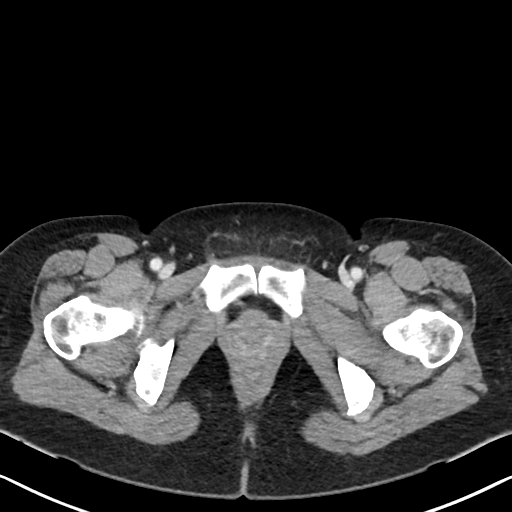
[im 19/94  soft-tissue]
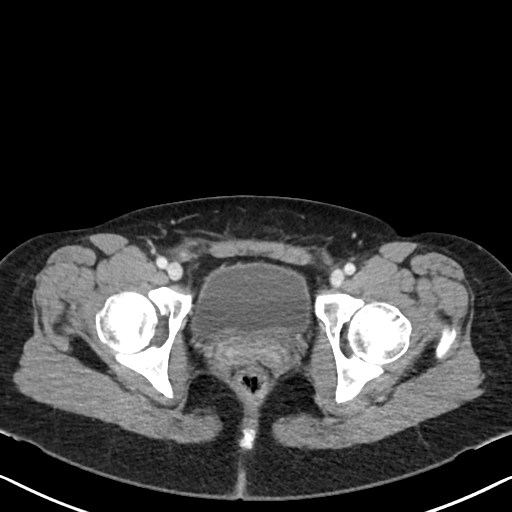
[im 28/94  soft-tissue]
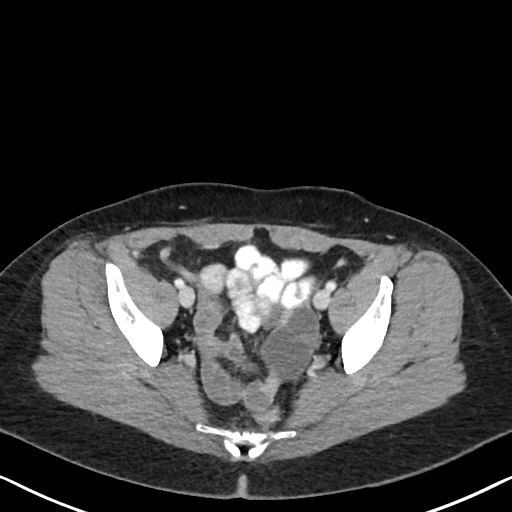
[im 38/94  soft-tissue]
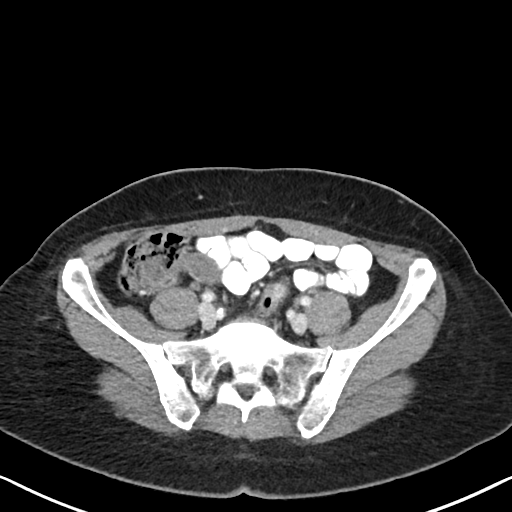
[im 42/94  soft-tissue]
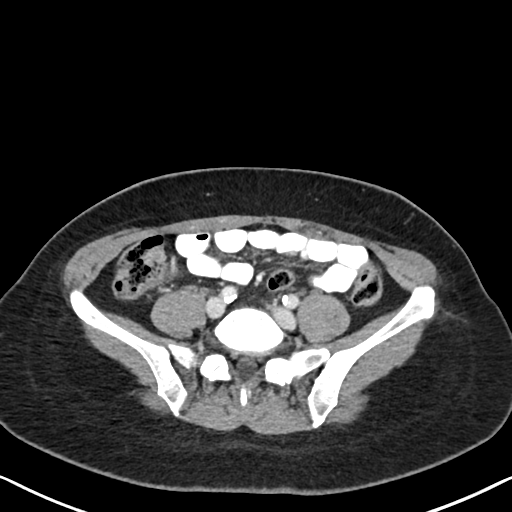
[im 52/94  soft-tissue]
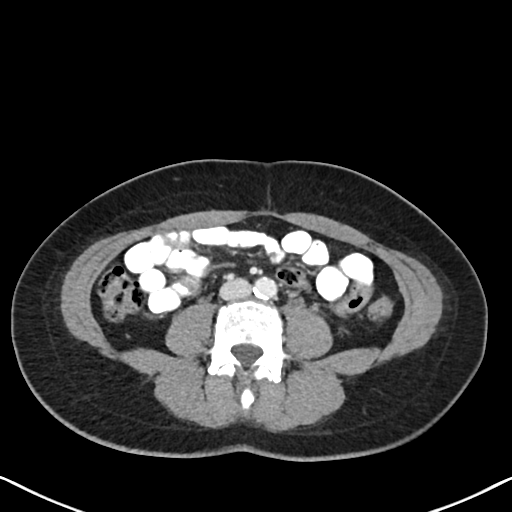
[im 56/94  soft-tissue]
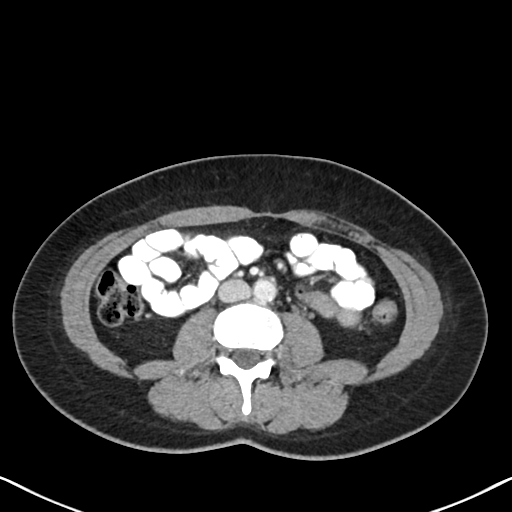
[im 66/94  soft-tissue]
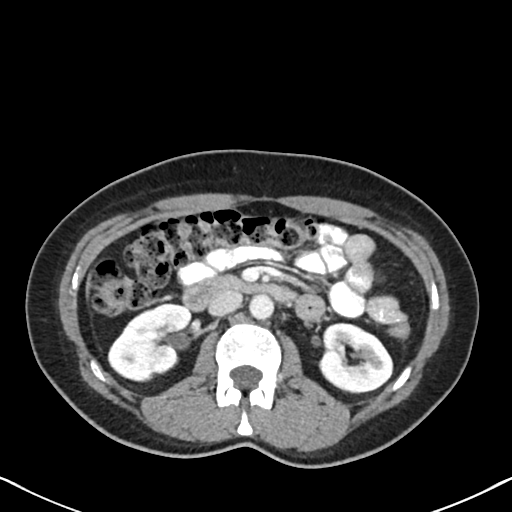
[im 66/94  bone]
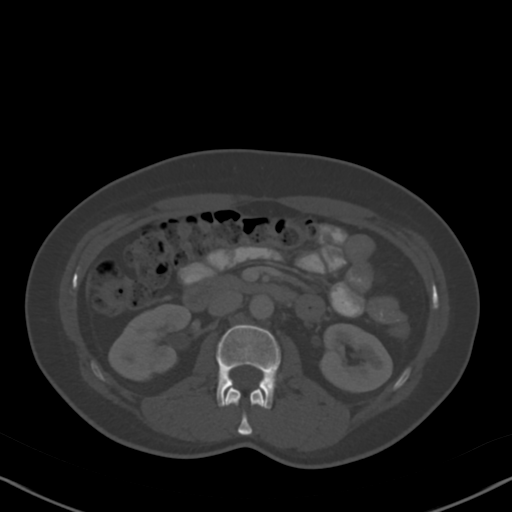
[im 75/94  soft-tissue]
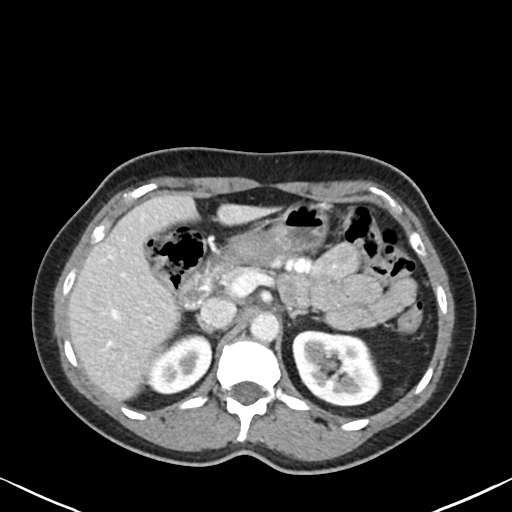
[im 80/94  soft-tissue]
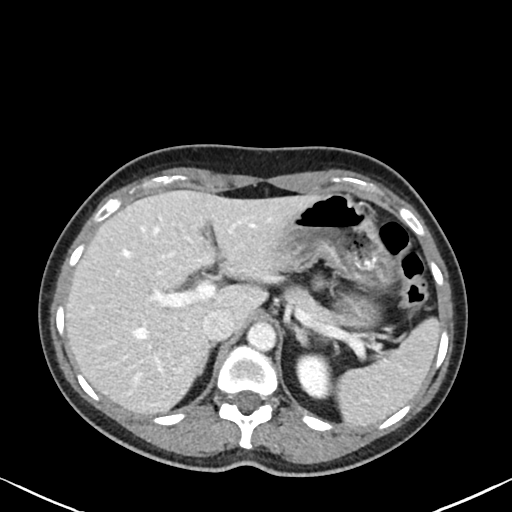
[im 89/94  soft-tissue]
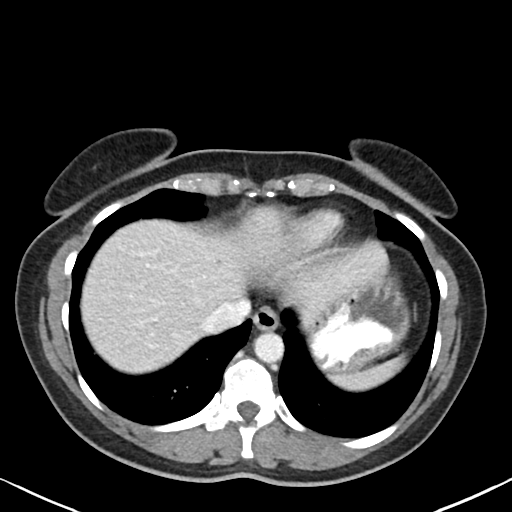

[Series 6: abd pelvis 2.00 br40 s3 cor · coronal · 0.68mm/px · 3 of 137 slices shown]
[im 46/137  soft-tissue]
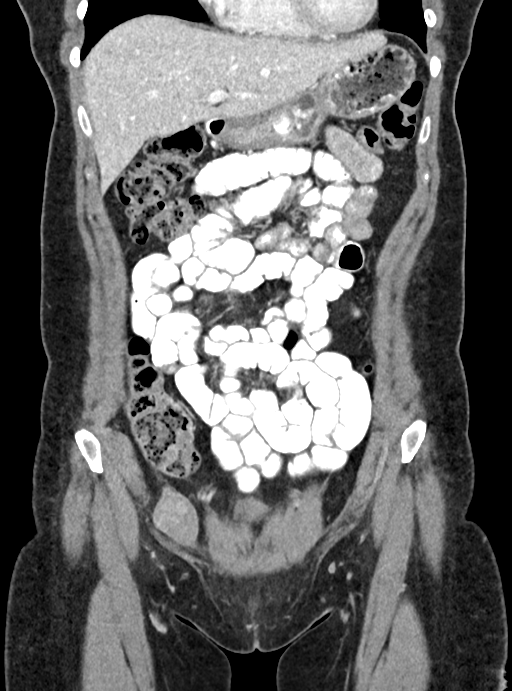
[im 61/137  soft-tissue]
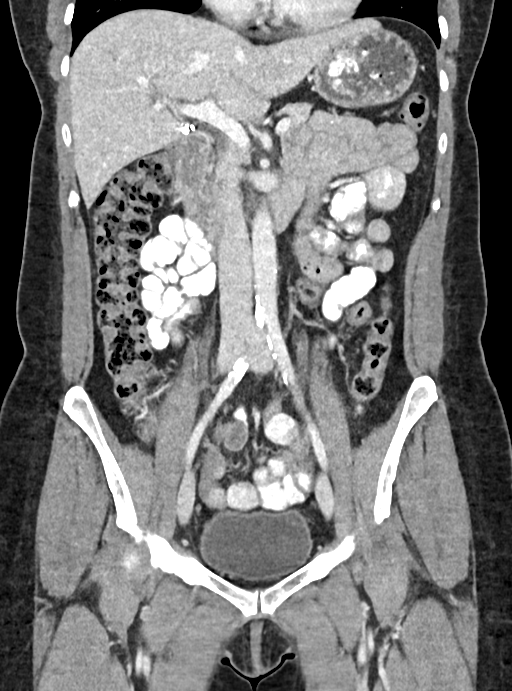
[im 76/137  soft-tissue]
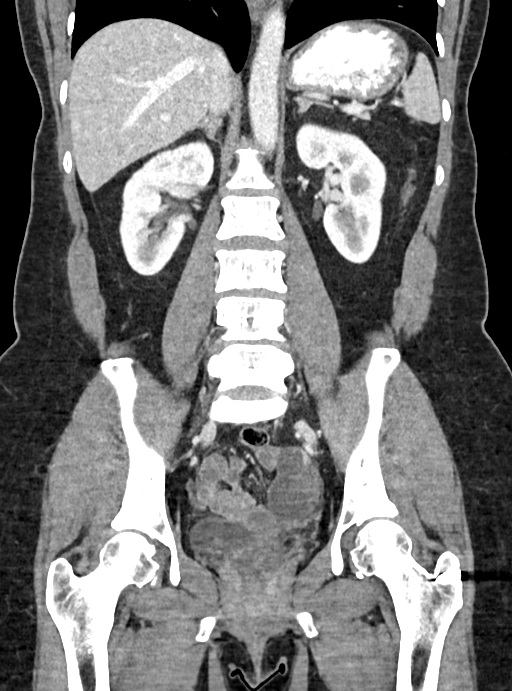

[15 of 46 positions shown; findings below may reference images not displayed]

FINDINGS: Lower chest: No acute abnormality.

Hepatobiliary: No focal liver abnormality is seen. Status post
cholecystectomy. No biliary dilatation.

Pancreas: Unremarkable. No pancreatic ductal dilatation or
surrounding inflammatory changes.

Spleen: Normal in size without focal abnormality.

Adrenals/Urinary Tract: Normal appearance of the adrenal glands.

The kidneys are both unremarkable. There is no mass or
hydronephrosis. Urinary bladder appears normal.

Stomach/Bowel: Stomach appears normal. The small bowel loops have a
normal course and caliber without obstruction. The appendix is
visualized within the right lower quadrant of the abdomen. Where the
tip of the appendix terminates is a well-circumscribed ovoid solid
soft tissue nodule or lymph node measuring 2.4 x 3.2 cm, image 71/2.
Unremarkable appearance of the colon.

Vascular/Lymphatic: Aortic atherosclerosis. No aneurysm. No
adenopathy identified within the abdomen or pelvis. No inguinal
adenopathy.

Reproductive: Status post hysterectomy. There are 2 cysts associated
with the left ovary. The largest measures 3.9 cm. The right ovary is
not confidently identified within the right adnexal region.

Other: Within the proximal right inguinal canal there is a solid
ovoid soft tissue structure measuring 3.2 x 2.4 cm.

Musculoskeletal: No acute or significant osseous findings.
IMPRESSION: 1. Solid ovoid soft tissue structure within the proximal right
inguinal canal is identified. The primary differential
considerations include ectopic ovary versus enlarged lymph node.
Prior to proceeding with soft tissue sampling recommend further
characterization of this structure with female ultrasound of the
pelvis as well as focused ultrasound within the right inguinal
canal.
2. Left ovary cysts.
3.  Aortic Atherosclerosis ([id number]).

## 2019-11-08 IMAGING — US US PELVIS COMPLETE TRANSABD/TRANSVAG
1 series · 13 of 25 positions shown · non-contrast
Comparison: CT 01/28/2018.

CLINICAL DATA: Enlarged lymph node.

EXAM:
TRANSABDOMINAL AND TRANSVAGINAL ULTRASOUND OF PELVIS
TECHNIQUE: Both transabdominal and transvaginal ultrasound examinations of the
pelvis were performed. Transabdominal technique was performed for
global imaging of the pelvis including uterus, ovaries, adnexal
regions, and pelvic cul-de-sac. It was necessary to proceed with
endovaginal exam following the transabdominal exam to visualize the
uterus and ovaries.

[Series 1: us pelvis complete transabd/transvag · 0.28mm/px · 13 of 57 slices shown]
[im 1/57]
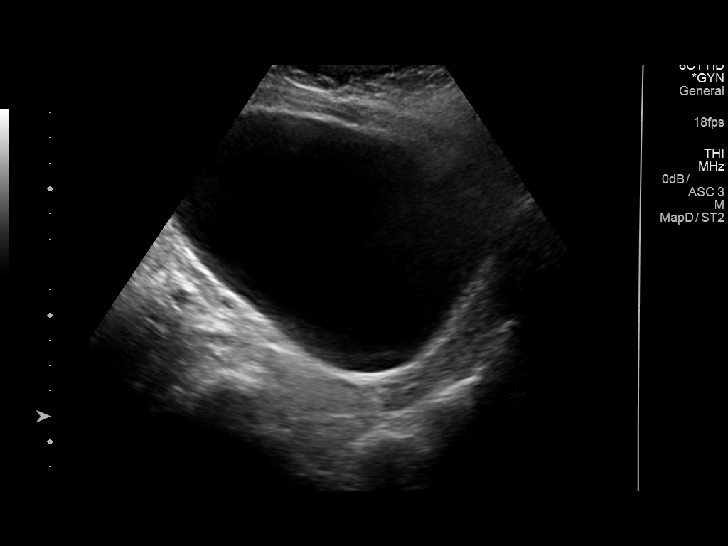
[im 5/57]
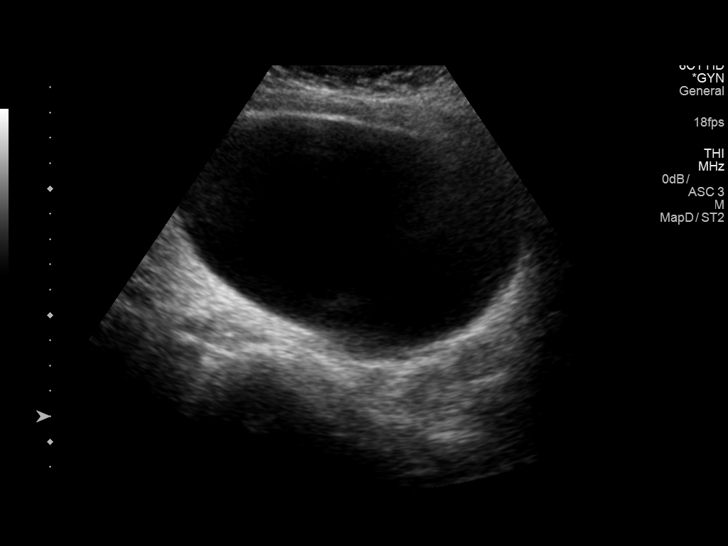
[im 10/57]
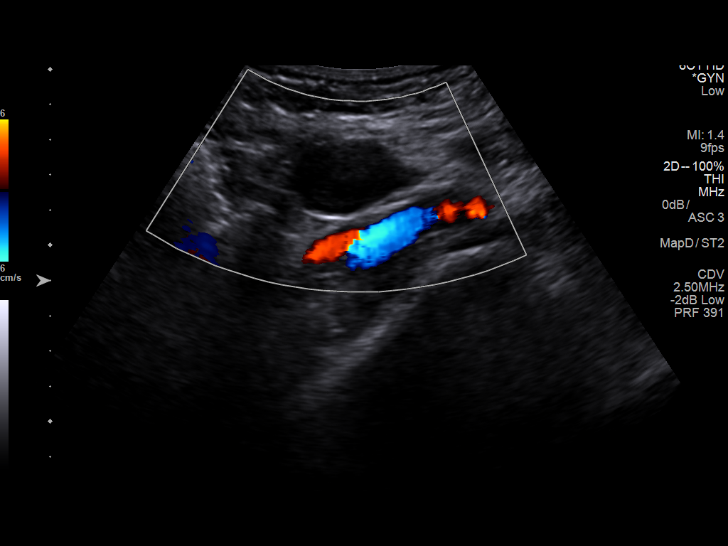
[im 15/57]
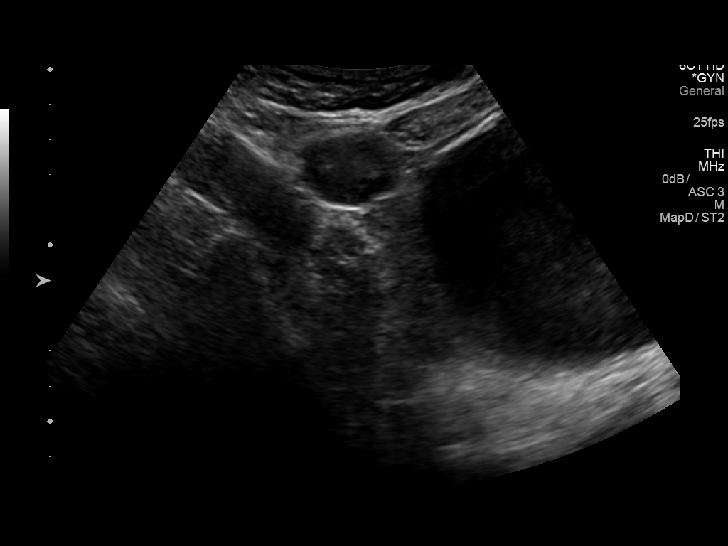
[im 19/57]
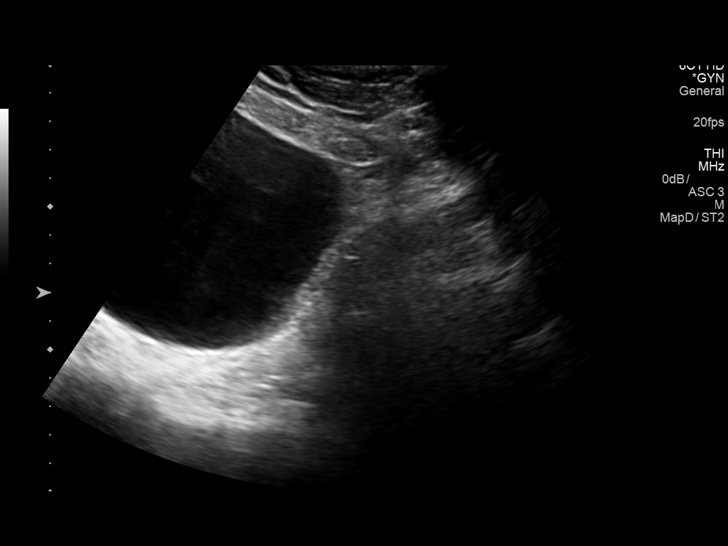
[im 24/57]
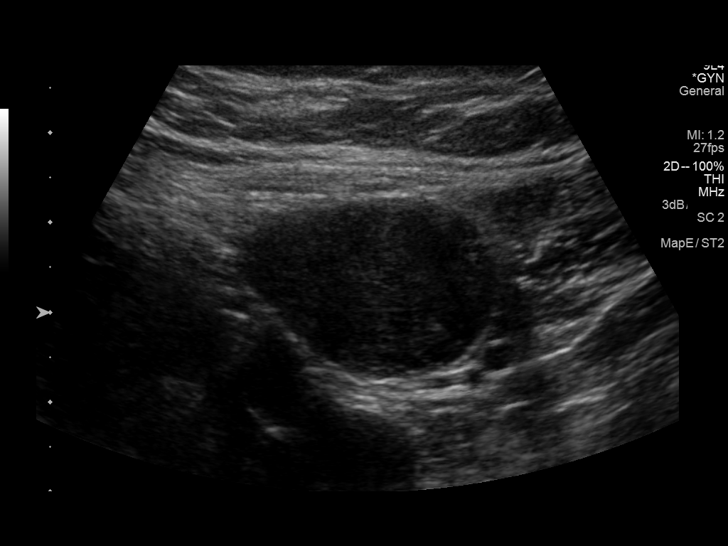
[im 29/57]
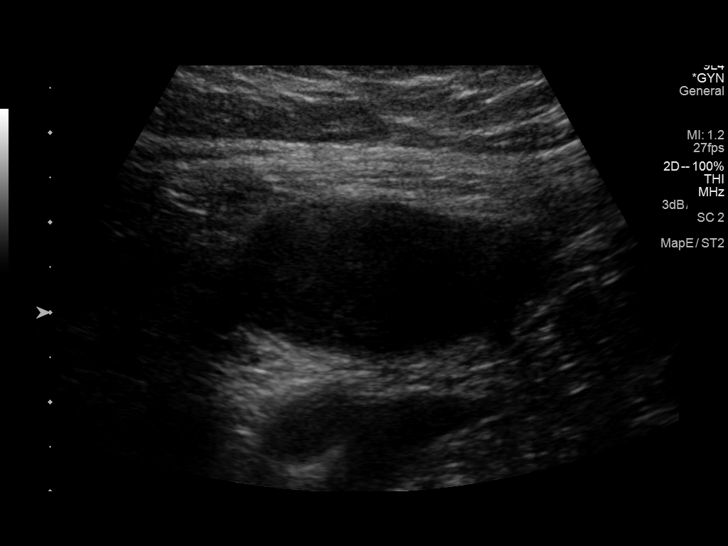
[im 33/57]
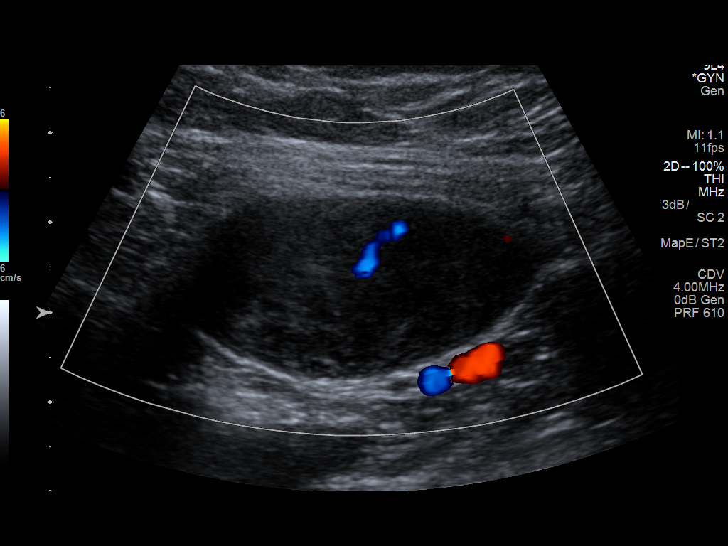
[im 38/57]
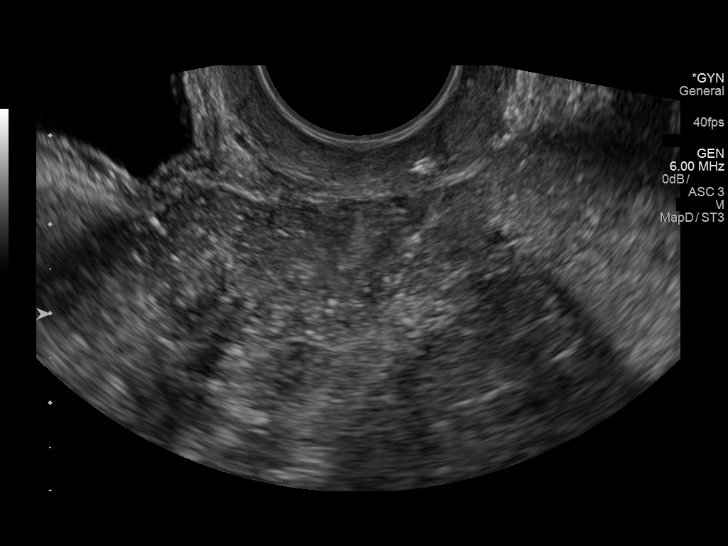
[im 43/57]
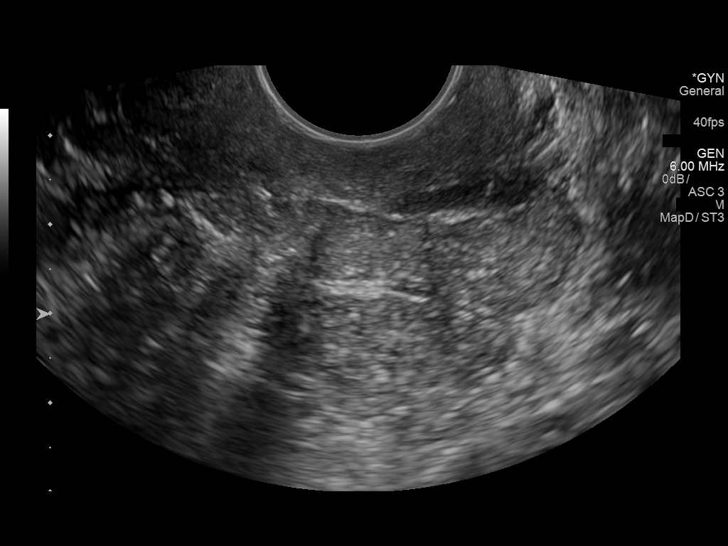
[im 47/57]
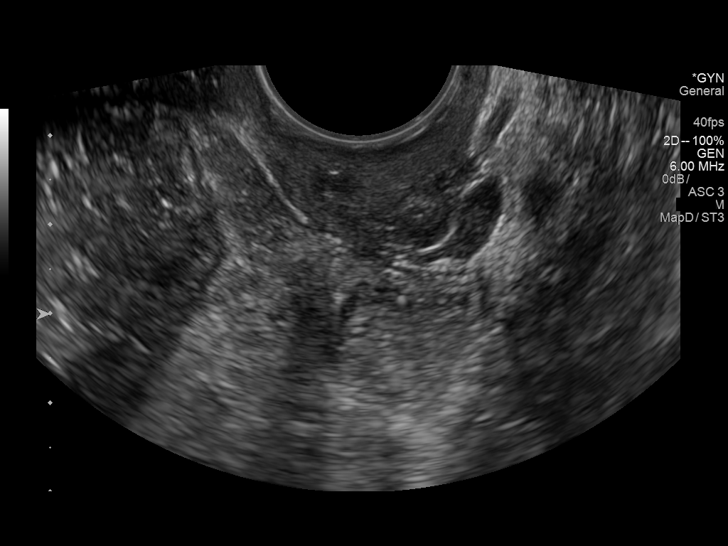
[im 52/57]
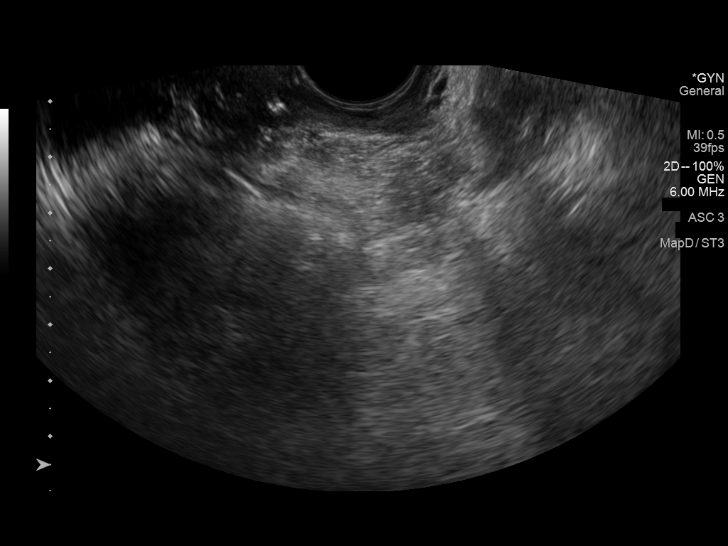
[im 57/57]
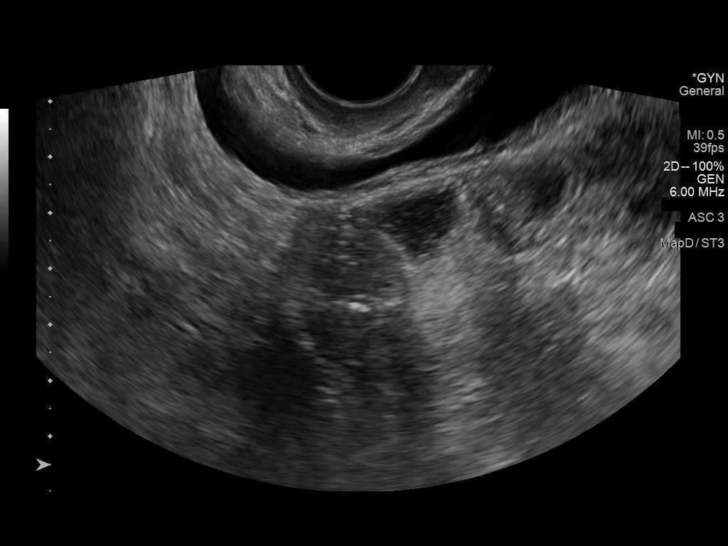

[13 of 25 positions shown; findings below may reference images not displayed]

FINDINGS: Uterus

Hysterectomy.

Right ovary

Measurements: Not identified in its normal position. As noted in the
right groin/lower pelvic cavity is a 4.1 x 2.0 x 3.2 cm ovoid mass.
This could represent a lymph node. This could represent a low and
anteriorly located ovary/ovarian mass. Ovarian malignancy cannot be
excluded.

Left ovary

Not visualized.

Other findings

No abnormal free fluid.
IMPRESSION: 4.1 x 2.0 x 3.2 cm ovoid mass in the right groin/low right anterior
pelvis. This could represent a lymph node. This could represent a
low and anteriorly located ovary/ovarian mass. Ovarian malignancy
cannot be excluded. Similar findings noted on prior CT. Surgical
evaluation should be considered.

## 2020-01-15 ENCOUNTER — Encounter (INDEPENDENT_AMBULATORY_CARE_PROVIDER_SITE_OTHER): Payer: Self-pay | Admitting: *Deleted

## 2020-04-13 ENCOUNTER — Encounter (INDEPENDENT_AMBULATORY_CARE_PROVIDER_SITE_OTHER): Payer: Self-pay | Admitting: *Deleted

## 2020-04-13 ENCOUNTER — Telehealth (INDEPENDENT_AMBULATORY_CARE_PROVIDER_SITE_OTHER): Payer: Self-pay | Admitting: *Deleted

## 2020-04-13 MED ORDER — PLENVU 140 G PO SOLR: 1.0000 | Freq: Once | ORAL | 0 refills | Status: AC

## 2020-04-13 NOTE — Telephone Encounter (Addendum)
Patient needs plenvu(copay card)

## 2020-04-13 NOTE — Telephone Encounter (Signed)
Ok to schedule.  Thanks,  Kennedy Brines Castaneda Mayorga, MD Gastroenterology and Hepatology [facility]  

## 2020-04-13 NOTE — Telephone Encounter (Signed)
Referring MD/PCP: pomposini   Procedure: tcs room 1  Reason/Indication:  Screening, famhx colon ca  Has patient had this procedure before?  no  If so, when, by whom and where?    Is there a family history of colon cancer?  Yes, grandmother  Who?  What age when diagnosed?    Is patient diabetic?   yes      Does patient have prosthetic heart valve or mechanical valve?  no  Do you have a pacemaker/defibrillator?  no  Has patient ever had endocarditis/atrial fibrillation? no  Does patient use oxygen? no  Has patient had joint replacement within last 12 months?  no  Is patient constipated or do they take laxatives? no  Does patient have a history of alcohol/drug use?  no  Is patient on blood thinner such as Coumadin, Plavix and/or Aspirin? no  Medications: novolog insulin pump, vit d3 1000 iu bid, mvi daily, omeprazole 20 mg daily, atorvastatin 10 mg daily, estradiol 2 mg daily, potassium 10 meq daily, losartan/hctz 100/25 mg daily, zyrtec 10 mg daily, ondansetron prn  Allergies: sulfur, ace inhibitors, dorvan, darvocet, phenergan  Medication Adjustment per Dr Rehman/Dr Jenetta Downer   Procedure date & time: 05/11/20

## 2020-04-19 ENCOUNTER — Other Ambulatory Visit (INDEPENDENT_AMBULATORY_CARE_PROVIDER_SITE_OTHER): Payer: Self-pay

## 2020-04-19 DIAGNOSIS — Z800 Family history of malignant neoplasm of digestive organs: Secondary | ICD-10-CM

## 2020-05-09 ENCOUNTER — Other Ambulatory Visit: Payer: Self-pay

## 2020-05-09 ENCOUNTER — Encounter (HOSPITAL_COMMUNITY): Payer: Self-pay | Admitting: Gastroenterology

## 2020-05-09 ENCOUNTER — Other Ambulatory Visit (HOSPITAL_COMMUNITY)
Admission: RE | Admit: 2020-05-09 | Discharge: 2020-05-09 | Disposition: A | Discharge status: Home / Self Care | Payer: BLUE CROSS/BLUE SHIELD | Source: Ambulatory Visit | Attending: Gastroenterology | Admitting: Gastroenterology

## 2020-05-09 DIAGNOSIS — Z90721 Acquired absence of ovaries, unilateral: Secondary | ICD-10-CM | POA: Diagnosis not present

## 2020-05-09 DIAGNOSIS — Z87442 Personal history of urinary calculi: Secondary | ICD-10-CM | POA: Diagnosis not present

## 2020-05-09 DIAGNOSIS — Z79899 Other long term (current) drug therapy: Secondary | ICD-10-CM | POA: Diagnosis not present

## 2020-05-09 DIAGNOSIS — Z20822 Contact with and (suspected) exposure to covid-19: Secondary | ICD-10-CM | POA: Diagnosis not present

## 2020-05-09 DIAGNOSIS — Z01818 Encounter for other preprocedural examination: Secondary | ICD-10-CM | POA: Diagnosis not present

## 2020-05-09 DIAGNOSIS — Z888 Allergy status to other drugs, medicaments and biological substances status: Secondary | ICD-10-CM | POA: Diagnosis not present

## 2020-05-09 DIAGNOSIS — Z9049 Acquired absence of other specified parts of digestive tract: Secondary | ICD-10-CM | POA: Diagnosis not present

## 2020-05-09 DIAGNOSIS — Z1211 Encounter for screening for malignant neoplasm of colon: Secondary | ICD-10-CM | POA: Diagnosis not present

## 2020-05-09 DIAGNOSIS — Z794 Long term (current) use of insulin: Secondary | ICD-10-CM | POA: Diagnosis not present

## 2020-05-09 DIAGNOSIS — Z882 Allergy status to sulfonamides status: Secondary | ICD-10-CM | POA: Diagnosis not present

## 2020-05-09 DIAGNOSIS — Z90710 Acquired absence of both cervix and uterus: Secondary | ICD-10-CM | POA: Diagnosis not present

## 2020-05-09 DIAGNOSIS — Z800 Family history of malignant neoplasm of digestive organs: Secondary | ICD-10-CM | POA: Diagnosis not present

## 2020-05-09 DIAGNOSIS — I10 Essential (primary) hypertension: Secondary | ICD-10-CM | POA: Diagnosis not present

## 2020-05-09 DIAGNOSIS — E119 Type 2 diabetes mellitus without complications: Secondary | ICD-10-CM | POA: Diagnosis not present

## 2020-05-09 LAB — BASIC METABOLIC PANEL
Anion gap: 10 (ref 5–15)
BUN: 12 mg/dL (ref 6–20)
CO2: 28 mmol/L (ref 22–32)
Calcium: 9.3 mg/dL (ref 8.9–10.3)
Chloride: 96 mmol/L — ABNORMAL LOW (ref 98–111)
Creatinine, Ser: 0.77 mg/dL (ref 0.44–1.00)
GFR, Estimated: >60 mL/min (ref >60)
Glucose, Bld: 248 mg/dL — ABNORMAL HIGH (ref 70–99)
Potassium: 3.9 mmol/L (ref 3.5–5.1)
Sodium: 134 mmol/L — ABNORMAL LOW (ref 135–145)

## 2020-05-10 LAB — SARS CORONAVIRUS 2 (TAT 6-24 HRS): SARS Coronavirus 2: NEGATIVE

## 2020-05-11 ENCOUNTER — Ambulatory Visit (HOSPITAL_COMMUNITY)
Admission: RE | Admit: 2020-05-11 | Discharge: 2020-05-11 | Disposition: A | Discharge status: Home / Self Care | Payer: BLUE CROSS/BLUE SHIELD | Attending: Gastroenterology | Admitting: Gastroenterology

## 2020-05-11 ENCOUNTER — Other Ambulatory Visit: Payer: Self-pay

## 2020-05-11 ENCOUNTER — Encounter (HOSPITAL_COMMUNITY)
Admission: RE | Disposition: A | Discharge status: Home / Self Care | Payer: Self-pay | Source: Home / Self Care | Attending: Gastroenterology

## 2020-05-11 ENCOUNTER — Ambulatory Visit (HOSPITAL_COMMUNITY): Payer: BLUE CROSS/BLUE SHIELD | Admitting: Anesthesiology

## 2020-05-11 ENCOUNTER — Encounter (HOSPITAL_COMMUNITY): Payer: Self-pay | Admitting: Gastroenterology

## 2020-05-11 DIAGNOSIS — Z888 Allergy status to other drugs, medicaments and biological substances status: Secondary | ICD-10-CM | POA: Insufficient documentation

## 2020-05-11 DIAGNOSIS — Z1211 Encounter for screening for malignant neoplasm of colon: Secondary | ICD-10-CM | POA: Diagnosis not present

## 2020-05-11 DIAGNOSIS — Z79899 Other long term (current) drug therapy: Secondary | ICD-10-CM | POA: Insufficient documentation

## 2020-05-11 DIAGNOSIS — Z9049 Acquired absence of other specified parts of digestive tract: Secondary | ICD-10-CM | POA: Insufficient documentation

## 2020-05-11 DIAGNOSIS — I10 Essential (primary) hypertension: Secondary | ICD-10-CM | POA: Insufficient documentation

## 2020-05-11 DIAGNOSIS — Z87442 Personal history of urinary calculi: Secondary | ICD-10-CM | POA: Insufficient documentation

## 2020-05-11 DIAGNOSIS — E119 Type 2 diabetes mellitus without complications: Secondary | ICD-10-CM | POA: Insufficient documentation

## 2020-05-11 DIAGNOSIS — Z882 Allergy status to sulfonamides status: Secondary | ICD-10-CM | POA: Insufficient documentation

## 2020-05-11 DIAGNOSIS — Z90721 Acquired absence of ovaries, unilateral: Secondary | ICD-10-CM | POA: Insufficient documentation

## 2020-05-11 DIAGNOSIS — Z90710 Acquired absence of both cervix and uterus: Secondary | ICD-10-CM | POA: Insufficient documentation

## 2020-05-11 DIAGNOSIS — Z794 Long term (current) use of insulin: Secondary | ICD-10-CM | POA: Insufficient documentation

## 2020-05-11 DIAGNOSIS — Z800 Family history of malignant neoplasm of digestive organs: Secondary | ICD-10-CM

## 2020-05-11 HISTORY — PX: COLONOSCOPY WITH PROPOFOL: SHX5780

## 2020-05-11 LAB — HM COLONOSCOPY

## 2020-05-11 LAB — GLUCOSE, CAPILLARY: Glucose-Capillary: 185 mg/dL — ABNORMAL HIGH (ref 70–99)

## 2020-05-11 SURGERY — COLONOSCOPY WITH PROPOFOL
Anesthesia: General

## 2020-05-11 MED ORDER — CHLORHEXIDINE GLUCONATE CLOTH 2 % EX PADS: 6.0000 | MEDICATED_PAD | Freq: Once | CUTANEOUS | Status: DC

## 2020-05-11 MED ORDER — STERILE WATER FOR IRRIGATION IR SOLN
Status: DC | PRN
  Administered 2020-05-11: 100 mL

## 2020-05-11 MED ORDER — PROPOFOL 10 MG/ML IV BOLUS
INTRAVENOUS | Status: DC | PRN
  Administered 2020-05-11: 60 mg via INTRAVENOUS

## 2020-05-11 MED ORDER — PROPOFOL 10 MG/ML IV BOLUS
INTRAVENOUS | Status: AC
  Filled 2020-05-11: qty 200

## 2020-05-11 MED ORDER — LIDOCAINE HCL (CARDIAC) PF 100 MG/5ML IV SOSY
PREFILLED_SYRINGE | INTRAVENOUS | Status: DC | PRN
  Administered 2020-05-11: 50 mg via INTRAVENOUS

## 2020-05-11 MED ORDER — LACTATED RINGERS IV SOLN: INTRAVENOUS | Status: DC

## 2020-05-11 MED ORDER — PROPOFOL 500 MG/50ML IV EMUL
INTRAVENOUS | Status: DC | PRN
  Administered 2020-05-11: 125 ug/kg/min via INTRAVENOUS

## 2020-05-11 NOTE — Transfer of Care (Signed)
Immediate Anesthesia Transfer of Care Note  Patient: Jill Savage  Procedure(s) Performed: COLONOSCOPY WITH PROPOFOL (N/A )  Patient Location: Endoscopy Unit  Anesthesia Type:MAC and General  Level of Consciousness: awake, alert , oriented and patient cooperative  Airway & Oxygen Therapy: Patient Spontanous Breathing  Post-op Assessment: Report given to RN and Post -op Vital signs reviewed and stable  Post vital signs: Reviewed and stable  Last Vitals:  Vitals Value Taken Time  BP    Temp    Pulse    Resp    SpO2      Last Pain:  Vitals:   05/11/20 0737  TempSrc:   PainSc: 0-No pain      Patients Stated Pain Goal: 8 (50/93/26 7124)  Complications: No complications documented.

## 2020-05-11 NOTE — Discharge Instructions (Signed)
Monitored Anesthesia Care, Care After These instructions provide you with information about caring for yourself after your procedure. Your health care provider may also give you more specific instructions. Your treatment has been planned according to current medical practices, but problems sometimes occur. Call your health care provider if you have any problems or questions after your procedure. What can I expect after the procedure? After your procedure, you may: Feel sleepy for several hours. Feel clumsy and have poor balance for several hours. Feel forgetful about what happened after the procedure. Have poor judgment for several hours. Feel nauseous or vomit. Have a sore throat if you had a breathing tube during the procedure. Follow these instructions at home: For at least 24 hours after the procedure:     Have a responsible adult stay with you. It is important to have someone help care for you until you are awake and alert. Rest as needed. Do not: Participate in activities in which you could fall or become injured. Drive. Use heavy machinery. Drink alcohol. Take sleeping pills or medicines that cause drowsiness. Make important decisions or sign legal documents. Take care of children on your own. Eating and drinking Follow the diet that is recommended by your health care provider. If you vomit, drink water, juice, or soup when you can drink without vomiting. Make sure you have little or no nausea before eating solid foods. General instructions Take over-the-counter and prescription medicines only as told by your health care provider. If you have sleep apnea, surgery and certain medicines can increase your risk for breathing problems. Follow instructions from your health care provider about wearing your sleep device: Anytime you are sleeping, including during daytime naps. While taking prescription pain medicines, sleeping medicines, or medicines that make you drowsy. If you  smoke, do not smoke without supervision. Keep all follow-up visits as told by your health care provider. This is important. Contact a health care provider if: You keep feeling nauseous or you keep vomiting. You feel light-headed. You develop a rash. You have a fever. Get help right away if: You have trouble breathing. Summary For several hours after your procedure, you may feel sleepy and have poor judgment. Have a responsible adult stay with you for at least 24 hours or until you are awake and alert. This information is not intended to replace advice given to you by your health care provider. Make sure you discuss any questions you have with your health care provider. Document Revised: 10/07/2017 Document Reviewed: 10/30/2015 Elsevier Patient Education  2020 [place] are being discharged to home.  Resume your previous diet.  Your physician has recommended a repeat colonoscopy in 10 years for screening purposes.    Colonoscopy, Adult, Care After This sheet gives you information about how to care for yourself after your procedure. Your doctor may also give you more specific instructions. If you have problems or questions, call your doctor. What can I expect after the procedure? After the procedure, it is common to have:  A small amount of blood in your poop (stool) for 24 hours.  Some gas.  Mild cramping or bloating in your belly (abdomen). Follow these instructions at home: Eating and drinking   Drink enough fluid to keep your pee (urine) pale yellow.  Follow instructions from your doctor about what you cannot eat or drink.  Return to your normal diet as told by your doctor. Avoid heavy or fried foods that are hard to digest. Activity  Rest as told by  your doctor.  Do not sit for a long time without moving. Get up to take short walks every 1-2 hours. This is important. Ask for help if you feel weak or unsteady.  Return to your normal activities as told by your  doctor. Ask your doctor what activities are safe for you. To help cramping and bloating:   Try walking around.  Put heat on your belly as told by your doctor. Use the heat source that your doctor recommends, such as a moist heat pack or a heating pad. ? Put a towel between your skin and the heat source. ? Leave the heat on for 20-30 minutes. ? Remove the heat if your skin turns bright red. This is very important if you are unable to feel pain, heat, or cold. You may have a greater risk of getting burned. General instructions  For the first 24 hours after the procedure: ? Do not drive or use machinery. ? Do not sign important documents. ? Do not drink alcohol. ? Do your daily activities more slowly than normal. ? Eat foods that are soft and easy to digest.  Take over-the-counter or prescription medicines only as told by your doctor.  Keep all follow-up visits as told by your doctor. This is important. Contact a doctor if:  You have blood in your poop 2-3 days after the procedure. Get help right away if:  You have more than a small amount of blood in your poop.  You see large clumps of tissue (blood clots) in your poop.  Your belly is swollen.  You feel like you may vomit (nauseous).  You vomit.  You have a fever.  You have belly pain that gets worse, and medicine does not help your pain. Summary  After the procedure, it is common to have a small amount of blood in your poop. You may also have mild cramping and bloating in your belly.  For the first 24 hours after the procedure, do not drive or use machinery, do not sign important documents, and do not drink alcohol.  Get help right away if you have a lot of blood in your poop, feel like you may vomit, have a fever, or have more belly pain. This information is not intended to replace advice given to you by your health care provider. Make sure you discuss any questions you have with your health care provider. Document  Revised: 02/02/2019 Document Reviewed: 02/02/2019 Elsevier Patient Education  [place].

## 2020-05-11 NOTE — Op Note (Signed)
[facility] Patient Name: Jill Savage Procedure Date: 05/11/2020 7:06 AM MRN: [id number] Date of Birth: 06/01/1971 Attending MD: Maylon Peppers ,  CSN: [id number] Age: 49 Admit Type: Outpatient Procedure:                Colonoscopy Indications:              Screening for colorectal malignant neoplasm Providers:                Maylon Peppers, Janeece Riggers, RN, Lambert Mody Referring MD:              Medicines:                Monitored Anesthesia Care Complications:            No immediate complications. Estimated Blood Loss:     Estimated blood loss: none. Procedure:                Pre-Anesthesia Assessment:                           - Prior to the procedure, a History and Physical                            was performed, and patient medications, allergies                            and sensitivities were reviewed. The patient's                            tolerance of previous anesthesia was reviewed.                           - The risks and benefits of the procedure and the                            sedation options and risks were discussed with the                            patient. All questions were answered and informed                            consent was obtained.                           - ASA Grade Assessment: I - A normal, healthy                            patient.                           After obtaining informed consent, the colonoscope                            was passed under direct vision. Throughout the                            procedure, the patient's blood pressure, pulse, and  oxygen saturations were monitored continuously. The                            [id number]-[id number] ([id number]) scope was introduced through                            the anus and advanced to the the cecum, identified                            by appendiceal orifice and ileocecal valve. The                            colonoscopy was performed without  difficulty. The                            patient tolerated the procedure well. The quality                            of the bowel preparation was excellent. Scope                            withdrawal time was 16 minutes. Scope In: 7:43:28 AM Scope Out: 8:07:26 AM Scope Withdrawal Time: 0 hours 16 minutes 32 seconds  Total Procedure Duration: 0 hours 23 minutes 58 seconds  Findings:      The perianal and digital rectal examinations were normal.      The colon (entire examined portion) appeared normal.      The retroflexed view of the distal rectum and anal verge was normal and       showed no anal or rectal abnormalities. Impression:               - The entire examined colon is normal.                           - The distal rectum and anal verge are normal on                            retroflexion view.                           - No specimens collected. Moderate Sedation:      Per Anesthesia Care Recommendation:           - Discharge patient to home (ambulatory).                           - Resume previous diet.                           - Repeat colonoscopy in 10 years for screening                            purposes. Procedure Code(s):        --- Professional ---  G0121, GC, Colorectal cancer screening; colonoscopy                            on individual not meeting criteria for high risk Diagnosis Code(s):        --- Professional ---                           Z12.11, Encounter for screening for malignant                            neoplasm of colon CPT copyright 2019 American Medical Association. All rights reserved. The codes documented in this report are preliminary and upon coder review may  be revised to meet current compliance requirements. Maylon Peppers, MD Maylon Peppers,  05/11/2020 8:13:20 AM This report has been signed electronically. Number of Addenda: 0

## 2020-05-11 NOTE — Anesthesia Postprocedure Evaluation (Signed)
Anesthesia Post Note  Patient: Jill Savage  Procedure(s) Performed: COLONOSCOPY WITH PROPOFOL (N/A )  Patient location during evaluation: Endoscopy Anesthesia Type: General and MAC Level of consciousness: awake and alert Pain management: pain level controlled Vital Signs Assessment: post-procedure vital signs reviewed and stable Respiratory status: spontaneous breathing Postop Assessment: no apparent nausea or vomiting Anesthetic complications: no   No complications documented.   Last Vitals:  Vitals:   05/11/20 0701  BP: (!) 147/63  Pulse: 80  Resp: 18  Temp: 36.6 C  SpO2: 100%    Last Pain:  Vitals:   05/11/20 0737  TempSrc:   PainSc: 0-No pain                 Everette Rank

## 2020-05-11 NOTE — Anesthesia Preprocedure Evaluation (Signed)
Anesthesia Evaluation  Patient identified by MRN, date of birth, ID band Patient awake    Reviewed: Allergy & Precautions, H&P , NPO status , Patient's Chart, lab work & pertinent test results, reviewed documented beta blocker date and time   History of Anesthesia Complications (+) PONV and history of anesthetic complications  Airway Mallampati: II  TM Distance: >3 FB Neck ROM: full    Dental no notable dental hx.    Pulmonary neg pulmonary ROS,    Pulmonary exam normal breath sounds clear to auscultation       Cardiovascular Exercise Tolerance: Good hypertension, + Valvular Problems/Murmurs  Rhythm:regular Rate:Normal     Neuro/Psych negative neurological ROS  negative psych ROS   GI/Hepatic negative GI ROS, Neg liver ROS,   Endo/Other  negative endocrine ROSdiabetes, Well Controlled, Type 1  Renal/GU negative Renal ROS  negative genitourinary   Musculoskeletal   Abdominal   Peds  Hematology negative hematology ROS (+)   Anesthesia Other Findings   Reproductive/Obstetrics negative OB ROS                             Anesthesia Physical Anesthesia Plan  ASA: II  Anesthesia Plan: General   Post-op Pain Management:    Induction:   PONV Risk Score and Plan: Propofol infusion  Airway Management Planned:   Additional Equipment:   Intra-op Plan:   Post-operative Plan:   Informed Consent: I have reviewed the patients History and Physical, chart, labs and discussed the procedure including the risks, benefits and alternatives for the proposed anesthesia with the patient or authorized representative who has indicated his/her understanding and acceptance.     Dental Advisory Given  Plan Discussed with: CRNA  Anesthesia Plan Comments:         Anesthesia Quick Evaluation

## 2020-05-11 NOTE — H&P (Signed)
Jill Savage is an 49 y.o. female.   Chief Complaint: Screening colonoscopy HPI: 49 year old female with past medical history of diabetes, hypertension and kidney stones, who comes to the hospital to undergo screening colonoscopy.  The patient has never had a colonoscopy in the past.  She denies having any complaints such as melena, hematochezia, abdominal pain or distention, change in her bowel movement consistency or frequency, no changes in her weight recently.  Grandmother had colon cancer in her 83s.  Past Medical History:  Diagnosis Date  . Diabetes mellitus without complication ([place])    type 1   . Family history of adverse reaction to anesthesia    mother N/V  . Heart murmur   . History of kidney stones   . Hypertension   . PONV (postoperative nausea and vomiting)     Past Surgical History:  Procedure Laterality Date  . ABDOMINAL HYSTERECTOMY    . APPENDECTOMY    . CESAREAN SECTION    . CHOLECYSTECTOMY    . HIP SURGERY     bil tears repaired  . INGUINAL HERNIA REPAIR Right 03/03/2018   Procedure: OPEN RIGHT INGUINAL HERNIA REPAIR WITH MESH;  Surgeon: Armandina Gemma, MD;  Location: [facility];  Service: General;  Laterality: Right;  . INSERTION OF MESH Right 03/03/2018   Procedure: OOPHORECTOMY;  Surgeon: Armandina Gemma, MD;  Location: [facility];  Service: General;  Laterality: Right;  . SHOULDER SURGERY     bil slap tear and frozen shoulder  . TEMPOROMANDIBULAR JOINT SURGERY    . TUBAL LIGATION      Family History  Problem Relation Age of Onset  . Colon cancer Other    Social History:  reports that she has never smoked. She has never used smokeless tobacco. She reports current alcohol use of about 1.0 standard drink of alcohol per week. She reports that she does not use drugs.  Allergies:  Allergies  Allergen Reactions  . Promethazine Other (See Comments)    Felt crazy   . Propoxyphene Nausea And Vomiting  . Sulfa Antibiotics Nausea Only and Swelling  . Ace Inhibitors  Swelling    Medications Prior to Admission  Medication Sig Dispense Refill  . acetaminophen (TYLENOL) 500 MG tablet Take 1,000 mg by mouth every 6 (six) hours as needed for moderate pain or headache.    . cetirizine (ZYRTEC) 10 MG tablet Take 10 mg by mouth daily.    . Cholecalciferol (VITAMIN D3) 1000 units CAPS Take 2,000 Units by mouth daily.     Marland Kitchen estradiol (ESTRACE) 2 MG tablet Take 2 mg by mouth daily.    Marland Kitchen losartan-hydrochlorothiazide (HYZAAR) 100-25 MG tablet Take 1 tablet by mouth daily.    . Multiple Vitamins-Minerals (MULTIVITAMIN PO) Take 2 tablets by mouth daily.     Marland Kitchen NOVOLOG 100 UNIT/ML injection by Pump Prime route See admin instructions. Use via Insulin pump  11  . omeprazole (PRILOSEC) 20 MG capsule Take 20 mg by mouth daily.  5  . potassium chloride (K-DUR) 10 MEQ tablet Take 20 mEq by mouth daily.  3  . atorvastatin (LIPITOR) 40 MG tablet Take 40 mg by mouth daily.  5  . cholestyramine light (PREVALITE) 4 g packet Take 4 g by mouth 2 (two) times daily as needed (for stomach flare ups).   3  . ondansetron (ZOFRAN) 8 MG tablet Take 8 mg by mouth every 8 (eight) hours as needed for nausea/vomiting.      Results for orders placed or performed  during the hospital encounter of 05/11/20 (from the past 48 hour(s))  Basic metabolic panel     Status: Abnormal   Collection Time: 05/09/20  3:53 PM  Result Value Ref Range   Sodium 134 (L) 135 - 145 mmol/L   Potassium 3.9 3.5 - 5.1 mmol/L   Chloride 96 (L) 98 - 111 mmol/L   CO2 28 22 - 32 mmol/L   Glucose, Bld 248 (H) 70 - 99 mg/dL    Comment: Glucose reference range applies only to samples taken after fasting for at least 8 hours.   BUN 12 6 - 20 mg/dL   Creatinine, Ser 0.77 0.44 - 1.00 mg/dL   Calcium 9.3 8.9 - 10.3 mg/dL   GFR, Estimated >60 >60 mL/min   Anion gap 10 5 - 15    Comment: Performed at [facility], [address], [place], [place] [address]  Glucose, capillary     Status: Abnormal   Collection Time: 05/11/20   6:58 AM  Result Value Ref Range   Glucose-Capillary 185 (H) 70 - 99 mg/dL    Comment: Glucose reference range applies only to samples taken after fasting for at least 8 hours.   No results found.  Review of Systems  Constitutional: Negative.   HENT: Negative.   Eyes: Negative.   Respiratory: Negative.   Cardiovascular: Negative.   Gastrointestinal: Negative.   Endocrine: Negative.   Genitourinary: Negative.   Musculoskeletal: Negative.   Skin: Negative.   Allergic/Immunologic: Negative.   Neurological: Negative.   Hematological: Negative.   Psychiatric/Behavioral: Negative.     Blood pressure (!) 147/63, pulse 80, temperature 97.8 F (36.6 C), temperature source Oral, resp. rate 18, height 5\' 3"  (1.6 m), weight 66.7 kg, SpO2 100 %. Physical Exam  GENERAL: The patient is AO x3, in no acute distress. HEENT: Head is normocephalic and atraumatic. EOMI are intact. Mouth is well hydrated and without lesions. NECK: Supple. No masses LUNGS: Clear to auscultation. No presence of rhonchi/wheezing/rales. Adequate chest expansion HEART: RRR, normal s1 and s2. ABDOMEN: Soft, nontender, no guarding, no peritoneal signs, and nondistended. BS +. No masses. EXTREMITIES: Without any cyanosis, clubbing, rash, lesions or edema. NEUROLOGIC: AOx3, no focal motor deficit. SKIN: no jaundice, no rashes  Assessment/Plan 49 year old female with past medical history of diabetes, hypertension and kidney stones, who comes to the hospital to undergo screening colonoscopy. , who comes to the hospital to undergo screening colonoscopy.  The patient is at average risk for colorectal cancer.  We will proceed with colonoscopy today.  Harvel Quale, MD 05/11/2020, 7:35 AM

## 2020-05-13 ENCOUNTER — Encounter (INDEPENDENT_AMBULATORY_CARE_PROVIDER_SITE_OTHER): Payer: Self-pay | Admitting: *Deleted

## 2020-05-16 ENCOUNTER — Encounter (HOSPITAL_COMMUNITY): Payer: Self-pay | Admitting: Gastroenterology
